# Patient Record
Sex: Male | Born: 1950 | Race: White | Hispanic: No | Marital: Married | Smoking: Former smoker
Health system: Southern US, Community
[De-identification: ages and names within clinical notes are randomized; demographics above are authoritative.]

## PROBLEM LIST (undated history)

## (undated) DIAGNOSIS — I10 Essential (primary) hypertension: Secondary | ICD-10-CM

## (undated) DIAGNOSIS — J189 Pneumonia, unspecified organism: Secondary | ICD-10-CM

## (undated) DIAGNOSIS — R569 Unspecified convulsions: Secondary | ICD-10-CM

## (undated) DIAGNOSIS — E785 Hyperlipidemia, unspecified: Secondary | ICD-10-CM

## (undated) DIAGNOSIS — K219 Gastro-esophageal reflux disease without esophagitis: Secondary | ICD-10-CM

## (undated) DIAGNOSIS — I639 Cerebral infarction, unspecified: Secondary | ICD-10-CM

## (undated) HISTORY — DX: Cerebral infarction, unspecified: I63.9

## (undated) HISTORY — DX: Pneumonia, unspecified organism: J18.9

## (undated) HISTORY — DX: Unspecified convulsions: R56.9

## (undated) HISTORY — PX: APPENDECTOMY: SHX54

## (undated) HISTORY — PX: UPPER GASTROINTESTINAL ENDOSCOPY: SHX188

## (undated) HISTORY — DX: Gastro-esophageal reflux disease without esophagitis: K21.9

## (undated) HISTORY — PX: CATARACT EXTRACTION, BILATERAL: SHX1313

## (undated) HISTORY — PX: EYE SURGERY: SHX253

---

## 2015-04-28 DIAGNOSIS — G40909 Epilepsy, unspecified, not intractable, without status epilepticus: Secondary | ICD-10-CM | POA: Insufficient documentation

## 2015-11-29 LAB — HM COLONOSCOPY

## 2016-10-29 DIAGNOSIS — R7303 Prediabetes: Secondary | ICD-10-CM | POA: Insufficient documentation

## 2017-07-10 ENCOUNTER — Encounter: Payer: Self-pay | Admitting: Neurology

## 2017-08-20 ENCOUNTER — Ambulatory Visit (INDEPENDENT_AMBULATORY_CARE_PROVIDER_SITE_OTHER): Payer: Medicare Other | Admitting: Neurology

## 2017-08-20 ENCOUNTER — Encounter: Payer: Self-pay | Admitting: Neurology

## 2017-08-20 VITALS — BP 144/66 | HR 58 | Ht 65.0 in | Wt 166.0 lb

## 2017-08-20 DIAGNOSIS — G40009 Localization-related (focal) (partial) idiopathic epilepsy and epileptic syndromes with seizures of localized onset, not intractable, without status epilepticus: Secondary | ICD-10-CM | POA: Diagnosis not present

## 2017-08-20 DIAGNOSIS — G250 Essential tremor: Secondary | ICD-10-CM | POA: Insufficient documentation

## 2017-08-20 MED ORDER — LEVETIRACETAM 1000 MG PO TABS: 1000.0000 mg | ORAL_TABLET | Freq: Two times a day (BID) | ORAL | 3 refills | Status: DC

## 2017-08-20 MED ORDER — PRIMIDONE 50 MG PO TABS: ORAL_TABLET | ORAL | 6 refills | Status: DC
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
## 2017-10-28 ENCOUNTER — Telehealth: Payer: Self-pay | Admitting: Neurology

## 2017-10-28 ENCOUNTER — Other Ambulatory Visit: Payer: Self-pay

## 2017-10-28 DIAGNOSIS — G250 Essential tremor: Secondary | ICD-10-CM

## 2017-10-28 MED ORDER — PRIMIDONE 50 MG PO TABS: ORAL_TABLET | ORAL | 3 refills | Status: DC

## 2017-10-28 NOTE — Telephone Encounter (Signed)
Rx - #480 (90 day supply) with 3 refills - sent to [facility].

## 2017-10-28 NOTE — Telephone Encounter (Signed)
Pt called and needs a prescription refill for primidone [facility]

## 2017-12-17 ENCOUNTER — Ambulatory Visit (INDEPENDENT_AMBULATORY_CARE_PROVIDER_SITE_OTHER): Payer: Medicare Other | Admitting: Neurology

## 2017-12-17 ENCOUNTER — Encounter: Payer: Self-pay | Admitting: Neurology

## 2017-12-17 VITALS — BP 142/64 | HR 62 | Ht 65.0 in | Wt 163.0 lb

## 2017-12-17 DIAGNOSIS — G40009 Localization-related (focal) (partial) idiopathic epilepsy and epileptic syndromes with seizures of localized onset, not intractable, without status epilepticus: Secondary | ICD-10-CM | POA: Diagnosis not present

## 2017-12-17 DIAGNOSIS — G250 Essential tremor: Secondary | ICD-10-CM | POA: Diagnosis not present

## 2017-12-17 MED ORDER — PRIMIDONE 50 MG PO TABS: ORAL_TABLET | ORAL | 3 refills | Status: DC
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
## 2018-04-29 ENCOUNTER — Ambulatory Visit (INDEPENDENT_AMBULATORY_CARE_PROVIDER_SITE_OTHER): Payer: Medicare Other | Admitting: Neurology

## 2018-04-29 ENCOUNTER — Encounter: Payer: Self-pay | Admitting: Neurology

## 2018-04-29 ENCOUNTER — Other Ambulatory Visit: Payer: Self-pay

## 2018-04-29 VITALS — BP 128/72 | HR 61 | Ht 65.0 in | Wt 163.0 lb

## 2018-04-29 DIAGNOSIS — G40009 Localization-related (focal) (partial) idiopathic epilepsy and epileptic syndromes with seizures of localized onset, not intractable, without status epilepticus: Secondary | ICD-10-CM

## 2018-04-29 DIAGNOSIS — G245 Blepharospasm: Secondary | ICD-10-CM

## 2018-04-29 DIAGNOSIS — G250 Essential tremor: Secondary | ICD-10-CM

## 2018-04-29 DIAGNOSIS — R413 Other amnesia: Secondary | ICD-10-CM | POA: Diagnosis not present

## 2018-04-29 MED ORDER — PRIMIDONE 50 MG PO TABS: ORAL_TABLET | ORAL | 3 refills | Status: DC

## 2018-04-29 MED ORDER — LEVETIRACETAM 1000 MG PO TABS: 1000.0000 mg | ORAL_TABLET | Freq: Two times a day (BID) | ORAL | 3 refills | Status: DC
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
## 2018-05-09 ENCOUNTER — Ambulatory Visit
Admission: RE | Admit: 2018-05-09 | Discharge: 2018-05-09 | Disposition: A | Discharge status: Home / Self Care | Payer: Medicare Other | Source: Ambulatory Visit | Attending: Neurology | Admitting: Neurology

## 2018-05-09 DIAGNOSIS — R413 Other amnesia: Secondary | ICD-10-CM

## 2018-05-09 DIAGNOSIS — G250 Essential tremor: Secondary | ICD-10-CM

## 2018-05-09 DIAGNOSIS — G40009 Localization-related (focal) (partial) idiopathic epilepsy and epileptic syndromes with seizures of localized onset, not intractable, without status epilepticus: Secondary | ICD-10-CM

## 2018-05-09 MED ORDER — GADOBENATE DIMEGLUMINE 529 MG/ML IV SOLN
15.0000 mL | Freq: Once | INTRAVENOUS | Status: AC | PRN
  Administered 2018-05-09: 15 mL via INTRAVENOUS

## 2018-05-19 ENCOUNTER — Telehealth: Payer: Self-pay

## 2018-05-19 NOTE — Telephone Encounter (Signed)
Spoke with pt's wife, Truddie HiddenLou, relaying message below.  She states that normal results don't really help as she just wants to know why pt is "acting stupid".  She states that "brain waves came back abnormal"  I let her know that EEG and MRI are different.  EEG shows brain wave activity whereas MRI shows structure of the brain.

## 2018-05-19 NOTE — Telephone Encounter (Signed)
-----   Message from Van ClinesKaren M Aquino, MD sent at 05/18/2018  4:26 PM EDT ----- Pls let him/wife know the MRI brain did not show any evidence of tumor, stroke, or bleed. Thanks

## 2018-10-19 DIAGNOSIS — L219 Seborrheic dermatitis, unspecified: Secondary | ICD-10-CM | POA: Insufficient documentation

## 2018-11-10 ENCOUNTER — Other Ambulatory Visit: Payer: Self-pay | Admitting: Neurology

## 2018-11-10 DIAGNOSIS — G40009 Localization-related (focal) (partial) idiopathic epilepsy and epileptic syndromes with seizures of localized onset, not intractable, without status epilepticus: Secondary | ICD-10-CM

## 2018-12-11 ENCOUNTER — Other Ambulatory Visit: Payer: Self-pay

## 2018-12-11 ENCOUNTER — Ambulatory Visit (INDEPENDENT_AMBULATORY_CARE_PROVIDER_SITE_OTHER): Payer: Medicare Other | Admitting: Neurology

## 2018-12-11 ENCOUNTER — Encounter: Payer: Self-pay | Admitting: Neurology

## 2018-12-11 VITALS — BP 116/64 | HR 63 | Ht 65.0 in | Wt 170.0 lb

## 2018-12-11 DIAGNOSIS — G40009 Localization-related (focal) (partial) idiopathic epilepsy and epileptic syndromes with seizures of localized onset, not intractable, without status epilepticus: Secondary | ICD-10-CM | POA: Diagnosis not present

## 2018-12-11 DIAGNOSIS — G250 Essential tremor: Secondary | ICD-10-CM | POA: Diagnosis not present

## 2018-12-11 DIAGNOSIS — R413 Other amnesia: Secondary | ICD-10-CM | POA: Diagnosis not present

## 2018-12-11 MED ORDER — PRIMIDONE 250 MG PO TABS: ORAL_TABLET | ORAL | 5 refills | Status: DC
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
## 2019-06-30 ENCOUNTER — Encounter (HOSPITAL_COMMUNITY): Payer: Self-pay | Admitting: *Deleted

## 2019-06-30 ENCOUNTER — Emergency Department (HOSPITAL_COMMUNITY): Payer: Medicare Other

## 2019-06-30 ENCOUNTER — Emergency Department (HOSPITAL_COMMUNITY)
Admission: EM | Admit: 2019-06-30 | Discharge: 2019-06-30 | Disposition: A | Discharge status: Home / Self Care | Payer: Medicare Other | Attending: Emergency Medicine | Admitting: Emergency Medicine

## 2019-06-30 ENCOUNTER — Other Ambulatory Visit: Payer: Self-pay

## 2019-06-30 DIAGNOSIS — Z87891 Personal history of nicotine dependence: Secondary | ICD-10-CM | POA: Insufficient documentation

## 2019-06-30 DIAGNOSIS — Z7982 Long term (current) use of aspirin: Secondary | ICD-10-CM | POA: Diagnosis not present

## 2019-06-30 DIAGNOSIS — Z79899 Other long term (current) drug therapy: Secondary | ICD-10-CM | POA: Diagnosis not present

## 2019-06-30 DIAGNOSIS — J449 Chronic obstructive pulmonary disease, unspecified: Secondary | ICD-10-CM | POA: Diagnosis not present

## 2019-06-30 DIAGNOSIS — I10 Essential (primary) hypertension: Secondary | ICD-10-CM | POA: Insufficient documentation

## 2019-06-30 DIAGNOSIS — R0602 Shortness of breath: Secondary | ICD-10-CM | POA: Insufficient documentation

## 2019-06-30 DIAGNOSIS — R062 Wheezing: Secondary | ICD-10-CM | POA: Diagnosis not present

## 2019-06-30 DIAGNOSIS — I700 Atherosclerosis of aorta: Secondary | ICD-10-CM | POA: Diagnosis not present

## 2019-06-30 DIAGNOSIS — R066 Hiccough: Secondary | ICD-10-CM | POA: Diagnosis not present

## 2019-06-30 DIAGNOSIS — J439 Emphysema, unspecified: Secondary | ICD-10-CM | POA: Insufficient documentation

## 2019-06-30 HISTORY — DX: Hyperlipidemia, unspecified: E78.5

## 2019-06-30 HISTORY — DX: Essential (primary) hypertension: I10

## 2019-06-30 LAB — CBC WITH DIFFERENTIAL/PLATELET
Abs Immature Granulocytes: 0.03 10*3/uL (ref 0.00–0.07)
Basophils Absolute: 0 10*3/uL (ref 0.0–0.1)
Basophils Relative: 1 %
Eosinophils Absolute: 0.1 10*3/uL (ref 0.0–0.5)
Eosinophils Relative: 2 %
HCT: 40 % (ref 39.0–52.0)
Hemoglobin: 13.3 g/dL (ref 13.0–17.0)
Immature Granulocytes: 1 %
Lymphocytes Relative: 26 %
Lymphs Abs: 1.7 10*3/uL (ref 0.7–4.0)
MCH: 30.6 pg (ref 26.0–34.0)
MCHC: 33.3 g/dL (ref 30.0–36.0)
MCV: 92 fL (ref 80.0–100.0)
Monocytes Absolute: 0.7 10*3/uL (ref 0.1–1.0)
Monocytes Relative: 11 %
Neutro Abs: 4 10*3/uL (ref 1.7–7.7)
Neutrophils Relative %: 59 %
Platelets: 229 10*3/uL (ref 150–400)
RBC: 4.35 MIL/uL (ref 4.22–5.81)
RDW: 13.1 % (ref 11.5–15.5)
WBC: 6.6 10*3/uL (ref 4.0–10.5)
nRBC: 0 % (ref 0.0–0.2)

## 2019-06-30 LAB — BASIC METABOLIC PANEL
Anion gap: 7 (ref 5–15)
BUN: 13 mg/dL (ref 8–23)
CO2: 26 mmol/L (ref 22–32)
Calcium: 8.8 mg/dL — ABNORMAL LOW (ref 8.9–10.3)
Chloride: 102 mmol/L (ref 98–111)
Creatinine, Ser: 0.93 mg/dL (ref 0.61–1.24)
GFR calc Af Amer: >60 mL/min (ref >60)
GFR calc non Af Amer: >60 mL/min (ref >60)
Glucose, Bld: 116 mg/dL — ABNORMAL HIGH (ref 70–99)
Potassium: 4.4 mmol/L (ref 3.5–5.1)
Sodium: 135 mmol/L (ref 135–145)

## 2019-06-30 MED ORDER — ALBUTEROL SULFATE HFA 108 (90 BASE) MCG/ACT IN AERS: 1.0000 | INHALATION_SPRAY | Freq: Four times a day (QID) | RESPIRATORY_TRACT | 2 refills | Status: DC | PRN

## 2019-06-30 MED ORDER — SUCRALFATE 1 G PO TABS: 1.0000 g | ORAL_TABLET | Freq: Three times a day (TID) | ORAL | 0 refills | Status: DC

## 2019-06-30 MED ORDER — IOHEXOL 300 MG/ML  SOLN
75.0000 mL | Freq: Once | INTRAMUSCULAR | Status: AC | PRN
  Administered 2019-06-30: 18:00:00 75 mL via INTRAVENOUS

## 2019-06-30 MED ORDER — ALBUTEROL SULFATE (2.5 MG/3ML) 0.083% IN NEBU: 5.0000 mg | INHALATION_SOLUTION | Freq: Once | RESPIRATORY_TRACT | Status: DC

## 2019-06-30 MED ORDER — SODIUM CHLORIDE (PF) 0.9 % IJ SOLN
INTRAMUSCULAR | Status: AC
  Filled 2019-06-30: qty 50

## 2019-06-30 NOTE — ED Provider Notes (Signed)
Care assumed from L. Cyndie MullMurphy PA-C.  Please see her full H&P.  In short,  Cole Bond is a 68 y.o. male presents for intractable hiccups and shortness of breath. This has been a problem x3 months. He has been evaluated by HI and ENT for this without symptom resolution. Today he felt short of breath three times while at work that resolved with putting his arms above his head. On ED arrival symptoms had resolved. Chest xray today shows COPD, this is new diagnosis. He is former smoker and quit x6 years ago. Labs today unremarkable. CT chest is pending. If negative will discharge home with albuterol inhaler.  Physical Exam  BP 132/66 (BP Location: Left Arm)   Pulse 62   Temp 98.7 F (37.1 C) (Oral)   Resp 18   Ht 5\' 5"  (1.651 m)   Wt 78.1 kg   SpO2 98%   BMI 28.65 kg/m   Physical Exam  PE: Constitutional: well-developed, well-nourished, no apparent distress HENT: normocephalic, atraumatic. no cervical adenopathy Cardiovascular: normal rate and rhythm, distal pulses intact Pulmonary/Chest: effort normal; breath sounds clear and equal bilaterally; no wheezes or rales Abdominal: soft and nontender Musculoskeletal: full ROM, no edema Neurological: alert with goal directed thinking Skin: warm and dry, no rash, no diaphoresis Psychiatric: normal mood and affect, normal behavior    ED Course/Procedures     Results for orders placed or performed during the hospital encounter of 06/30/19 (from the past 24 hour(s))  Basic metabolic panel     Status: Abnormal   Collection Time: 06/30/19  2:34 PM  Result Value Ref Range   Sodium 135 135 - 145 mmol/L   Potassium 4.4 3.5 - 5.1 mmol/L   Chloride 102 98 - 111 mmol/L   CO2 26 22 - 32 mmol/L   Glucose, Bld 116 (H) 70 - 99 mg/dL   BUN 13 8 - 23 mg/dL   Creatinine, Ser 1.610.93 0.61 - 1.24 mg/dL   Calcium 8.8 (L) 8.9 - 10.3 mg/dL   GFR calc non Af Amer >60 >60 mL/min   GFR calc Af Amer >60 >60 mL/min   Anion gap 7 5 - 15  CBC with  Differential     Status: None   Collection Time: 06/30/19  2:34 PM  Result Value Ref Range   WBC 6.6 4.0 - 10.5 K/uL   RBC 4.35 4.22 - 5.81 MIL/uL   Hemoglobin 13.3 13.0 - 17.0 g/dL   HCT 09.640.0 04.539.0 - 40.952.0 %   MCV 92.0 80.0 - 100.0 fL   MCH 30.6 26.0 - 34.0 pg   MCHC 33.3 30.0 - 36.0 g/dL   RDW 81.113.1 91.411.5 - 78.215.5 %   Platelets 229 150 - 400 K/uL   nRBC 0.0 0.0 - 0.2 %   Neutrophils Relative % 59 %   Neutro Abs 4.0 1.7 - 7.7 K/uL   Lymphocytes Relative 26 %   Lymphs Abs 1.7 0.7 - 4.0 K/uL   Monocytes Relative 11 %   Monocytes Absolute 0.7 0.1 - 1.0 K/uL   Eosinophils Relative 2 %   Eosinophils Absolute 0.1 0.0 - 0.5 K/uL   Basophils Relative 1 %   Basophils Absolute 0.0 0.0 - 0.1 K/uL   Immature Granulocytes 1 %   Abs Immature Granulocytes 0.03 0.00 - 0.07 K/uL   CHEST - 2 VIEW COMPARISON: None. FINDINGS: The heart size is normal. Lungs are hyperinflated. There is no edema or effusion. No focal airspace disease is present. The visualized soft tissues  and bony thorax are unremarkable. IMPRESSION: 1. Changes of COPD. 2. No acute cardiopulmonary disease. Electronically Signed By: Wynetta Fines.D.On: 06/30/2019 13:27  CT CHEST WITH CONTRAST  TECHNIQUE: Multidetector CT imaging of the chest was performed during intravenous contrast administration. CONTRAST: 7mL OMNIPAQUE IOHEXOL 300 MG/ML SOLN COMPARISON: Same day chest radiograph FINDINGS: Cardiovascular: Atherosclerotic calcification of the coronary arteries. Normal heart size. No pericardial effusion. Atherosclerotic plaque within the normal caliber aorta. Mediastinum/Nodes: No enlarged mediastinal or axillary lymph nodes. Thyroid gland and trachea demonstrate no significant findings. There is mild mural thickening of the esophagus. Lungs/Pleura: No consolidation, features of edema, pneumothorax, or effusion. Biapical pleuroparenchymal scarring is present. Background centrilobular and paraseptal emphysematous  changes within an apical predominance. Mild airways thickening. No suspicious pulmonary nodules or masses. Upper Abdomen: No acute abnormalities present in the visualized portions of the upper abdomen. Musculoskeletal: Multilevel degenerative changes are present in the imaged portions of the spine. No chest wall mass or suspicious bone lesions identified.  IMPRESSION: 1. No acute intrathoracic process. 2. Chronic bronchitic changes. 3. Mild mural thickening of the esophagus, suggestive of esophagitis. 4. Aortic Atherosclerosis (ICD10-I70.0) 5. Emphysema (ICD10-J43.9). Electronically Signed By: Lovena Le M.D. On: 06/30/2019 18:16    MDM   Patient is well-appearing, no acute distress.  Lungs are clear to auscultation all fields, he has SpO2 100% on room air. Labs are unremarkable. Chest xray shows COPD changes and CT chest shows esophagitis and emphysema, negative for acute intrathoracic processes. On reassessment pt continues to be well appearing and denies dyspnea. He ambulated around the room, no hypoxia noted. Will discharge home with albuterol inhaler and carafate for esophagitis. Pt has appointment already shceduled with his pcp for next week, recommend he keep that appointment. Pt agrees with plan and has no further questions. He is stable for discharge home.  This note was prepared using Dragon voice recognition software and may include unintentional dictation errors due to the inherent limitations of voice recognition software.       Flint Melter 06/30/19 1941    Isla Pence, MD 06/30/19 2158

## 2019-06-30 NOTE — ED Triage Notes (Signed)
Pt states he has had hiccups for 70 + days, noted episodes of SHOB with it at the beginning then went away, Today he had 3 episodes of SHOB for just a few minutes, when he raises his arms symptoms resolve.

## 2019-06-30 NOTE — Discharge Instructions (Addendum)
You have been seen today for hiccups and shortness of breath. Please read and follow all provided instructions. Return to the emergency room for worsening condition or new concerning symptoms.    Chest x-ray today shows COPD and the CT scan of your chest shows esophagitis.  1. Medications:  -Prescription has been sent to your pharmacy for Carafate.  This can help with esophagitis.  Please take as prescribed. -Prescription also sent for albuterol.  This is for shortness of breath, please take as needed. Continue usual home medications Take medications as prescribed. Please review all of the medicines and only take them if you do not have an allergy to them.   2. Treatment: rest, drink plenty of fluids  3. Follow Up: Please follow up with your primary doctor in 2-5 days for discussion of your diagnoses and further evaluation after today's visit  It is also a possibility that you have an allergic reaction to any of the medicines that you have been prescribed - Everybody reacts differently to medications and while MOST people have no trouble with most medicines, you may have a reaction such as nausea, vomiting, rash, swelling, shortness of breath. If this is the case, please stop taking the medicine immediately and contact your physician.  ?

## 2019-06-30 NOTE — ED Provider Notes (Signed)
[facility] Provider Note   CSN: [id number] Arrival date & time: 06/30/19  1226    History   Chief Complaint Chief Complaint  Patient presents with  . Shortness of Breath    HPI Cole Bond is a 68 y.o. male.     68 year old male presents with complaint of intractable hiccups and shortness of breath.  Patient states that he has had hiccups since May 2020, has been to his PCP as well as GI and ENT.  Patient has had an endoscopy as well as a CT of the abdomen pelvis which have not found a source for his hiccups.  Patient has tried multiple medications including Reglan, baclofen, Thorazine.  So these medications have provided temporary relief.  Patient was at work today when he began to feel short of breath, shortness of breath improved after raising his arms for a few seconds.  Reports 3 episodes like this today, states he has had similar episodes in the past but not recently and not as frequent.  Patient is a former smoker, states he is quit 6 to 7 years ago, no known history of asthma or chronic lung disease.  Denies fevers, chills, unintentional weight loss, headaches, changes with vision, speech, gait.  No other complaints or concerns.     Past Medical History:  Diagnosis Date  . Hyperlipemia   . Hypertension   . Seizures [facility](HCC)     Patient Active Problem List   Diagnosis Date Noted  . Localization-related idiopathic epilepsy and epileptic syndromes with seizures of localized onset, not intractable, without status epilepticus (HCC) 08/20/2017  . Essential tremor 08/20/2017    Past Surgical History:  Procedure Laterality Date  . APPENDECTOMY          Home Medications    Prior to Admission medications   Medication Sig Start Date End Date Taking? Authorizing Provider  aspirin (GOODSENSE ASPIRIN) 325 MG tablet Take 325 mg by mouth daily.    Yes [provider]  hydrochlorothiazide (MICROZIDE) 12.5 MG capsule Take 12.5  mg by mouth daily.  06/18/17  Yes [provider]  levETIRAcetam (KEPPRA) 1000 MG tablet TAKE 1 TABLET TWICE A DAY Patient taking differently: Take 1,000 mg by mouth 2 (two) times daily.  11/10/18  Yes Van ClinesAquino, Karen M, MD  lisinopril (PRINIVIL,ZESTRIL) 20 MG tablet Take 20 mg by mouth daily.  10/19/18  Yes [provider]  metoCLOPramide (REGLAN) 10 MG tablet Take 10 mg by mouth 4 (four) times daily. 06/25/19  Yes [provider]  omeprazole (PRILOSEC) 40 MG capsule Take 40 mg by mouth 2 (two) times daily. 05/21/19  Yes [provider]  pravastatin (PRAVACHOL) 40 MG tablet Take 40 mg by mouth at bedtime.  02/03/17  Yes [provider]  primidone (MYSOLINE) 250 MG tablet Take 1 tablet twice a day Patient taking differently: Take 250 mg by mouth 2 (two) times daily.  12/11/18  Yes Van ClinesAquino, Karen M, MD    Family History Family History  Problem Relation Age of Onset  . Breast cancer Mother   . Stroke Father     Social History Social History   Tobacco Use  . Smoking status: Former Games developermoker  . Smokeless tobacco: Never Used  Substance Use Topics  . Alcohol use: Yes    Alcohol/week: 1.0 standard drinks    Types: 1 Cans of beer per week  . Drug use: No     Allergies   Patient has no known allergies.  Review of Systems Review of Systems  Constitutional: Negative for appetite change, chills, fever and unexpected weight change.  HENT: Negative for ear pain.   Eyes: Negative for visual disturbance.  Respiratory: Positive for shortness of breath and wheezing. Negative for cough.   Cardiovascular: Negative for chest pain.  Gastrointestinal: Negative for nausea and vomiting.  Musculoskeletal: Negative for arthralgias and myalgias.  Skin: Negative for rash and wound.  Allergic/Immunologic: Negative for immunocompromised state.  Neurological: Negative for dizziness, speech difficulty and weakness.  Psychiatric/Behavioral: Negative for confusion.   All other systems reviewed and are negative.    Physical Exam Updated Vital Signs BP 132/66 (BP Location: Left Arm)   Pulse 62   Temp 98.7 F (37.1 C) (Oral)   Resp 18   Ht 5\' 5"  (1.651 m)   Wt 78.1 kg   SpO2 98%   BMI 28.65 kg/m   Physical Exam Vitals signs and nursing note reviewed.  Constitutional:      General: He is not in acute distress.    Appearance: He is well-developed. He is not diaphoretic.  HENT:     Head: Normocephalic and atraumatic.  Cardiovascular:     Rate and Rhythm: Normal rate and regular rhythm.     Heart sounds: Normal heart sounds.  Pulmonary:     Effort: Pulmonary effort is normal.     Breath sounds: Normal breath sounds. No decreased breath sounds or wheezing.     Comments: Constant hiccups throughout exam Musculoskeletal:     Right lower leg: No edema.     Left lower leg: No edema.  Skin:    General: Skin is warm and dry.  Neurological:     Mental Status: He is alert and oriented to person, place, and time.  Psychiatric:        Behavior: Behavior normal.      ED Treatments / Results  Labs (all labs ordered are listed, but only abnormal results are displayed) Labs Reviewed  BASIC METABOLIC PANEL - Abnormal; Notable for the following components:      Result Value   Glucose, Bld 116 (*)    Calcium 8.8 (*)    All other components within normal limits  CBC WITH DIFFERENTIAL/PLATELET    EKG EKG Interpretation  Date/Time:  Wednesday June 30 2019 12:45:07 EDT Ventricular Rate:  65 PR Interval:    QRS Duration: 93 QT Interval:  388 QTC Calculation: 404 R Axis:   63 Text Interpretation:  Sinus rhythm Non-specific ST-t changes No previous tracing Confirmed by Cathren LaineSteinl, Kevin ([id number]) on 06/30/2019 1:19:56 PM   Radiology Dg Chest 2 View  Result Date: 06/30/2019 CLINICAL DATA:  Shortness of breath.  Intermittent hiccups. EXAM: CHEST - 2 VIEW COMPARISON:  None. FINDINGS: The heart size is normal. Lungs are hyperinflated. There is no  edema or effusion. No focal airspace disease is present. The visualized soft tissues and bony thorax are unremarkable. IMPRESSION: 1. Changes of COPD. 2. No acute cardiopulmonary disease. Electronically Signed   By: Marin Robertshristopher  Mattern M.D.   On: 06/30/2019 13:27    Procedures Procedures (including critical care time)  Medications Ordered in ED Medications  albuterol (PROVENTIL) (2.5 MG/3ML) 0.083% nebulizer solution 5 mg (5 mg Nebulization Not Given 06/30/19 1501)     Initial Impression / Assessment and Plan / ED Course  I have reviewed the triage vital signs and the nursing notes.  Pertinent labs & imaging results that were available during my care of the patient were reviewed by me and  considered in my medical decision making (see chart for details).  Clinical Course as of Jun 29 1710  Wed Aug 05, 657  4313 68 year old male presents with complaint of hiccups since May 2020.  Patient seen by PCP, GI, ENT for same without source of hiccups found and without successful treatment.  Patient states at work today he began to feel short of breath, this resolved after a few seconds of raising his arms.  Patient states that he was wheezing with feeling short of breath.  Patient has felt short of breath off-and-on with his hiccups but never 3 episodes in 1 day which prompted him to come to the ER today.  Exam patient's lung sounds are clear, he is not short of breath at this time, he has constant hiccups throughout the exam. Patient has had an endoscopy as well as CT abdomen pelvis without source found for hiccups.  Chest x-ray today shows COPD, patient is a former smoker, quit smoking 6 years ago.  Gust chest x-ray results with patient, offered CT chest, patient is agreeable. Care signed out awaiting CT chest.    [LM]    Clinical Course User Index [LM] Tacy Learn, PA-C      Final Clinical Impressions(s) / ED Diagnoses   Final diagnoses:  None    ED Discharge Orders    None        Tacy Learn, PA-C 06/30/19 1711    Lajean Saver, MD 06/30/19 1723

## 2019-07-06 ENCOUNTER — Other Ambulatory Visit: Payer: Self-pay

## 2019-07-06 ENCOUNTER — Telehealth (INDEPENDENT_AMBULATORY_CARE_PROVIDER_SITE_OTHER): Payer: Medicare Other | Admitting: Neurology

## 2019-07-06 ENCOUNTER — Encounter: Payer: Self-pay | Admitting: Neurology

## 2019-07-06 VITALS — Ht 65.0 in | Wt 165.0 lb

## 2019-07-06 DIAGNOSIS — G250 Essential tremor: Secondary | ICD-10-CM

## 2019-07-06 DIAGNOSIS — R066 Hiccough: Secondary | ICD-10-CM | POA: Diagnosis not present

## 2019-07-06 DIAGNOSIS — G40009 Localization-related (focal) (partial) idiopathic epilepsy and epileptic syndromes with seizures of localized onset, not intractable, without status epilepticus: Secondary | ICD-10-CM

## 2019-07-06 MED ORDER — LEVETIRACETAM 1000 MG PO TABS: 1000.0000 mg | ORAL_TABLET | Freq: Two times a day (BID) | ORAL | 3 refills | Status: DC

## 2019-07-06 MED ORDER — PRIMIDONE 250 MG PO TABS: ORAL_TABLET | ORAL | 3 refills | Status: DC

## 2019-07-06 MED ORDER — CHLORPROMAZINE HCL 25 MG PO TABS: ORAL_TABLET | ORAL | 0 refills | Status: DC
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
## 2019-07-13 ENCOUNTER — Other Ambulatory Visit: Payer: Self-pay | Admitting: Neurology

## 2019-07-13 ENCOUNTER — Telehealth: Payer: Self-pay | Admitting: Neurology

## 2019-07-13 MED ORDER — CHLORPROMAZINE HCL 25 MG PO TABS: ORAL_TABLET | ORAL | 1 refills | Status: DC

## 2019-07-13 NOTE — Telephone Encounter (Signed)
Sorry about that, pls have him stop the metoclopramide and just take the chlorpromazine. Thanks!

## 2019-07-13 NOTE — Telephone Encounter (Signed)
Patient is calling in wanting to let you know everything is well. He is wanting to keep same prescription until MRI during last week of September. He is needing refill if he can stay on same medications- [facility] on [address] in [place]. Chlorpromazine medication. He took the last pill last night. Thanks!

## 2019-07-13 NOTE — Telephone Encounter (Signed)
What do you want him to do with the metoclopramide?

## 2019-07-13 NOTE — Telephone Encounter (Signed)
pls let him know I sent a prescription for another month. At this point if hiccups stopped, pls tell him to metoclopramide and just stay on the chlorpromazine. Thanks

## 2019-07-14 ENCOUNTER — Telehealth: Payer: Self-pay | Admitting: Neurology

## 2019-07-14 NOTE — Telephone Encounter (Signed)
Pt informed via mychart of Dr. Amparo Bristol recommendations

## 2019-07-14 NOTE — Telephone Encounter (Signed)
Spoke with wife, York Cerise. She state that pt had been without chlorpromazine for 2 days and the hiccups returned. She picked up the refill that Dr. Delice Lesch sent in yesterday today. They will see if this help. If it does not they will call back. Wife knows to have pt d/c Reglan.

## 2019-07-14 NOTE — Telephone Encounter (Signed)
Pt wants to speak to some one about what is going with him per the VM patient hiccups

## 2019-08-09 ENCOUNTER — Telehealth: Payer: Self-pay | Admitting: Neurology

## 2019-08-09 ENCOUNTER — Ambulatory Visit
Admission: RE | Admit: 2019-08-09 | Discharge: 2019-08-09 | Disposition: A | Discharge status: Home / Self Care | Payer: Medicare Other | Source: Ambulatory Visit | Attending: Neurology | Admitting: Neurology

## 2019-08-09 DIAGNOSIS — R066 Hiccough: Secondary | ICD-10-CM

## 2019-08-09 MED ORDER — GADOBUTROL 1 MMOL/ML IV SOLN
8.0000 mL | Freq: Once | INTRAVENOUS | Status: AC | PRN
  Administered 2019-08-09: 8 mL via INTRAVENOUS

## 2019-08-09 NOTE — Telephone Encounter (Signed)
-----   Message from Alda Berthold, DO sent at 08/09/2019 12:41 PM EDT ----- Please inform patient that his MRI brain is stable as compared to Jan 2019 - mild-age related changes, no new findings.

## 2019-08-09 NOTE — Telephone Encounter (Signed)
Left message informing of normal MRI. No changes since last MRI in 2019. Only mild age related changes. Instructed to call with any concerns.

## 2019-08-09 NOTE — Telephone Encounter (Signed)
Patient called and said he had an MRI today. He is requesting a call from a nurse as soon as possible because he still has the hiccups after fours month.   He further explained he stopped taking the chlorpromazine because it was making him sleepy. He'd like to know what to do next as he is frustrated.

## 2019-08-10 MED ORDER — HALOPERIDOL 1 MG PO TABS: ORAL_TABLET | ORAL | 3 refills | Status: DC

## 2019-08-10 NOTE — Telephone Encounter (Signed)
Spoke with pts wife. Is there any solution to the hiccups?  Wife has researched surgery and a pain injection that might would help.

## 2019-08-10 NOTE — Telephone Encounter (Signed)
I will have to see who does this type of surgery in [place], there are other medications which we can try, if he is agreeable, I have sent in a prescription for Haldol 1mg : Take 1 tab qhs x week, then increase to 2 tabs qhs. Main side effect is drowsiness.

## 2019-08-11 ENCOUNTER — Other Ambulatory Visit: Payer: Self-pay

## 2019-08-11 NOTE — Telephone Encounter (Signed)
Haldol sent to pharmacy. Wife ok to try. They would like for you to look into surgery or possible the injection to get rid of the hiccups. They are out very frustrated.

## 2019-08-18 ENCOUNTER — Other Ambulatory Visit: Payer: Self-pay

## 2019-08-18 ENCOUNTER — Ambulatory Visit (INDEPENDENT_AMBULATORY_CARE_PROVIDER_SITE_OTHER): Payer: Medicare Other | Admitting: Neurology

## 2019-08-18 ENCOUNTER — Encounter: Payer: Self-pay | Admitting: Neurology

## 2019-08-18 VITALS — BP 138/79 | HR 60 | Temp 98.4°F | Resp 12 | Ht 65.0 in | Wt 159.0 lb

## 2019-08-18 DIAGNOSIS — G40009 Localization-related (focal) (partial) idiopathic epilepsy and epileptic syndromes with seizures of localized onset, not intractable, without status epilepticus: Secondary | ICD-10-CM | POA: Diagnosis not present

## 2019-08-18 DIAGNOSIS — G250 Essential tremor: Secondary | ICD-10-CM | POA: Diagnosis not present

## 2019-08-18 DIAGNOSIS — R066 Hiccough: Secondary | ICD-10-CM | POA: Diagnosis not present

## 2019-08-18 MED ORDER — PRIMIDONE 250 MG PO TABS: ORAL_TABLET | ORAL | 3 refills | Status: DC

## 2019-08-18 MED ORDER — LEVETIRACETAM 1000 MG PO TABS: 1000.0000 mg | ORAL_TABLET | Freq: Two times a day (BID) | ORAL | 3 refills | Status: DC

## 2019-08-18 MED ORDER — HALOPERIDOL 1 MG PO TABS: ORAL_TABLET | ORAL | 2 refills | Status: DC
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
## 2019-08-24 ENCOUNTER — Ambulatory Visit: Payer: TRICARE For Life (TFL) | Admitting: Neurology

## 2019-08-26 ENCOUNTER — Telehealth: Payer: Self-pay | Admitting: Neurology

## 2019-08-26 NOTE — Telephone Encounter (Signed)
Patient called regarding his medication and he has been on it for 120 days. He said it is not working for him. He was taking 2 at night and now I in the AM and 1 in the PM. He would like to see what the next step is? Please Call. Thanks

## 2019-09-02 NOTE — Telephone Encounter (Signed)
Patient called and said he is still continuously having hiccups and the medication is not helping at all. He'd like another alternative prescribed to make it stop. Patient expressed frustration with his condition.  [place]

## 2019-09-02 NOTE — Telephone Encounter (Signed)
Dr. Delice Lesch, The pt was last seen in the office on 08/18/19. Does he need to try the medication longer or go ahead and try something else?

## 2019-09-03 NOTE — Telephone Encounter (Signed)
We had talked about at this point needing to just keep trying medication until we find something that helps. Ok to stop the Haldol. Pls start Depakote ER 250mg : take 1 tablet every night for 3 days, then increase to 2 tabs qhs. Call our office in 2 weeks, we will increase at that point. Also, let's do an MRI cervical spine with and without contrast, Dx: syrinx, intractable hiccups. Thanks

## 2019-09-06 ENCOUNTER — Other Ambulatory Visit: Payer: Self-pay

## 2019-09-06 DIAGNOSIS — G950 Syringomyelia and syringobulbia: Secondary | ICD-10-CM

## 2019-09-06 DIAGNOSIS — R066 Hiccough: Secondary | ICD-10-CM

## 2019-09-06 MED ORDER — DIVALPROEX SODIUM ER 250 MG PO TB24: ORAL_TABLET | ORAL | 3 refills | Status: DC

## 2019-09-06 NOTE — Telephone Encounter (Signed)
Left message for pt to return call.  Orders placed for MRI and Depakote as Dr. Delice Lesch prescribed.

## 2019-09-07 ENCOUNTER — Telehealth: Payer: Self-pay | Admitting: Neurology

## 2019-09-07 NOTE — Telephone Encounter (Signed)
Pt wife informed of Depakote and MRI being ordered. Instructions given. She knows to call in two weeks with an update.

## 2019-09-07 NOTE — Telephone Encounter (Signed)
Spouse called regarding [place] and a call they got from the office yesterday regarding medication. Please Call. Thanks

## 2019-09-18 ENCOUNTER — Other Ambulatory Visit: Payer: Self-pay

## 2019-09-18 ENCOUNTER — Ambulatory Visit
Admission: RE | Admit: 2019-09-18 | Discharge: 2019-09-18 | Disposition: A | Discharge status: Home / Self Care | Payer: Medicare Other | Source: Ambulatory Visit | Attending: Neurology | Admitting: Neurology

## 2019-09-18 DIAGNOSIS — R066 Hiccough: Secondary | ICD-10-CM

## 2019-09-18 DIAGNOSIS — G950 Syringomyelia and syringobulbia: Secondary | ICD-10-CM

## 2019-09-18 MED ORDER — GADOBENATE DIMEGLUMINE 529 MG/ML IV SOLN
15.0000 mL | Freq: Once | INTRAVENOUS | Status: AC | PRN
  Administered 2019-09-18: 15 mL via INTRAVENOUS

## 2019-09-24 ENCOUNTER — Telehealth: Payer: Self-pay | Admitting: Neurology

## 2019-09-24 NOTE — Telephone Encounter (Signed)
Left message with the after hour service on 09-24-19 @ 12:42 pm   Caller states his new medication is not working still has the hiccups and wants to let his Dr know please call

## 2019-09-25 ENCOUNTER — Other Ambulatory Visit: Payer: TRICARE For Life (TFL)

## 2019-09-27 MED ORDER — DIVALPROEX SODIUM ER 250 MG PO TB24: ORAL_TABLET | ORAL | 3 refills | Status: DC

## 2019-09-27 NOTE — Telephone Encounter (Signed)
Called spoke with spouse York Cerise she was informed of provider response they will do the increase dose. She will contact PCP to request referral for second opinion at [facility].   Pt will need NEW RX for increase dose sent to [facility] sent  Comment  08/20/17 DPR SIGNED FOR ALL [facility]. OK TO DISCLOSE PHI TO LOU Wierzbicki ([place]) [phone]. OK TO LEAVE A DETAILED MESSAGE ON HOME ANSWERING MACHINE. [facility]

## 2019-09-27 NOTE — Telephone Encounter (Signed)
If no side effects from the Depakote 250mg  2 tabs qhs, pls increase to 2 tabs BID. We had discussed that at this point it is really trying different medications to see what can help. Consider second opinion at [facility] or [facility]

## 2019-11-17 IMAGING — MR MR CERVICAL SPINE WO/W CM
6 of 8 series · 33 of 48 positions shown · IV contrast (multihance)
Comparison: Cranial MRI 08/09/2019 and 05/09/2018.

CLINICAL DATA: Intractable hiccups with bilateral hand tremors for
4 months. No acute injury or prior relevant surgery. Cervical syrinx
on cranial MRI.

Creatinine was obtained on site at [HOSPITAL] at [HOSPITAL].
Results: Creatinine 0.9 mg/dL.
EXAM:
MRI CERVICAL SPINE WITHOUT AND WITH CONTRAST
TECHNIQUE: Multiplanar and multiecho pulse sequences of the cervical spine, to
include the craniocervical junction and cervicothoracic junction,
were obtained without and with intravenous contrast.
CONTRAST:  15mL MULTIHANCE GADOBENATE DIMEGLUMINE 529 MG/ML IV SOLN

[Series 2: T1 · sagittal · 3.0mm · 0.82mm/px · 5 of 17 slices shown (1 of 2)]
[im 1/17]
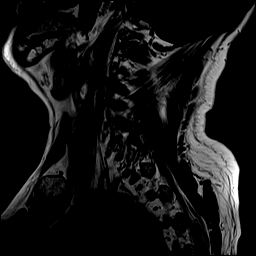
[im 5/17]
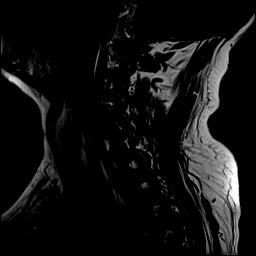
[im 9/17]
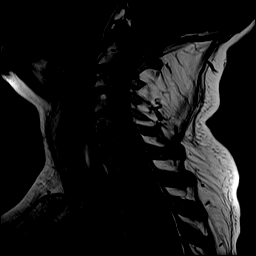
[im 13/17]
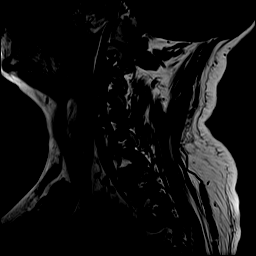
[im 17/17]
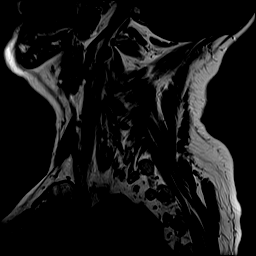

[Series 3: STIR · sagittal · 3.0mm · 0.82mm/px · 4 of 17 slices shown]
[im 1/17]
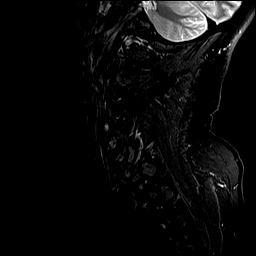
[im 5/17]
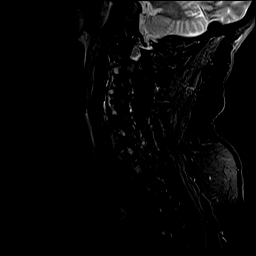
[im 9/17]
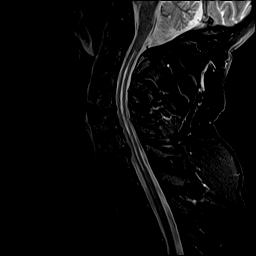
[im 13/17]
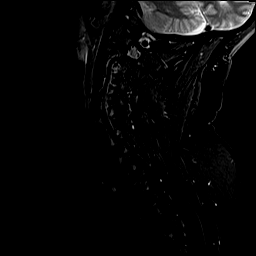

[Series 4: T2 · axial · 3.0mm · 0.70mm/px · z∈[-88,+1]mm · 7 of 27 slices shown (1 of 2)]
[im 1/27]
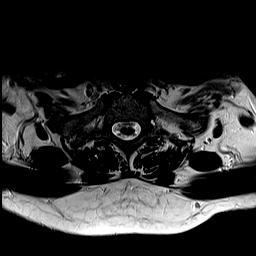
[im 5/27]
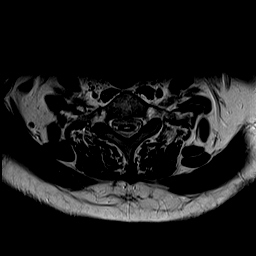
[im 9/27]
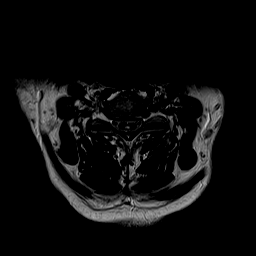
[im 14/27]
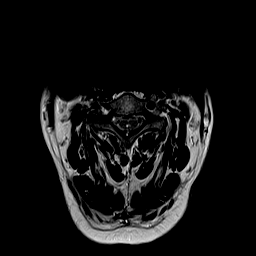
[im 18/27]
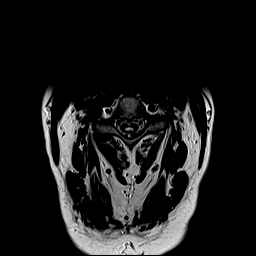
[im 22/27]
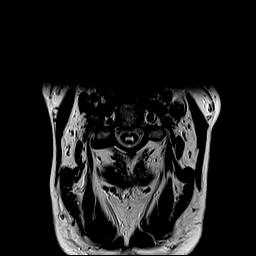
[im 27/27]
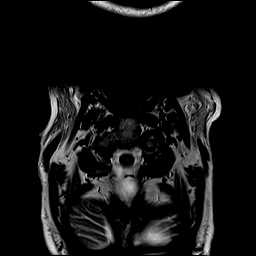

[Series 6: T1 · axial · 3.0mm · 0.35mm/px · z∈[-88,+1]mm · 7 of 27 slices shown (2 of 2)]
[im 1/27]
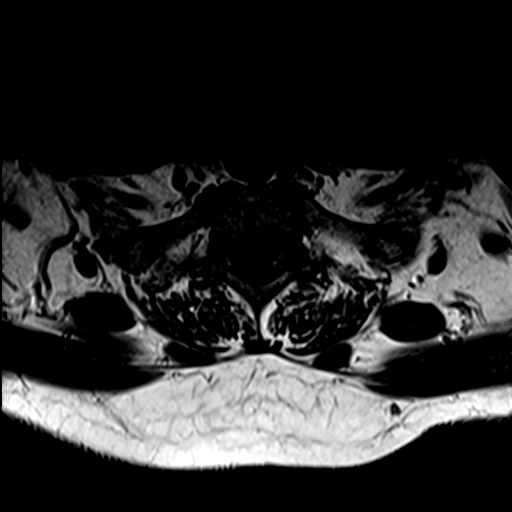
[im 5/27]
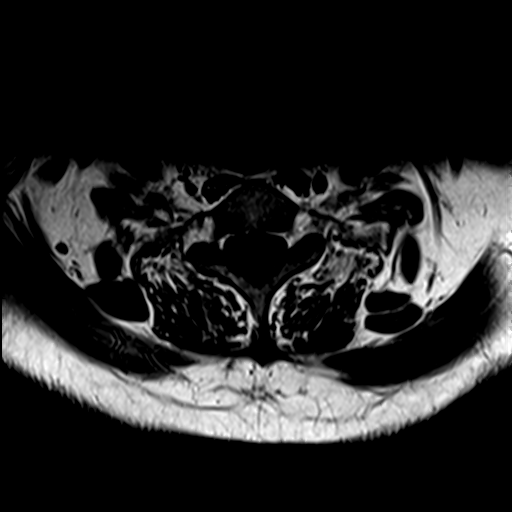
[im 9/27]
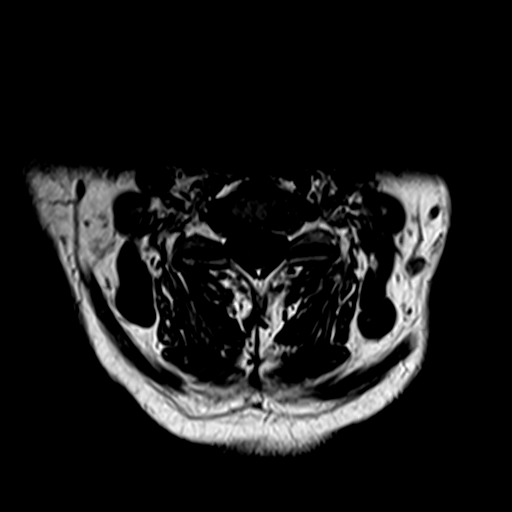
[im 14/27]
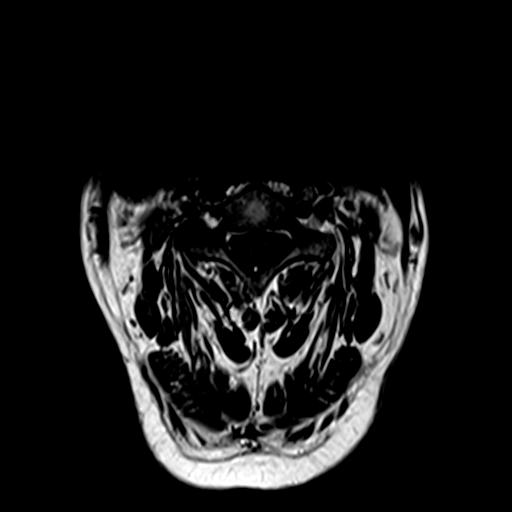
[im 18/27]
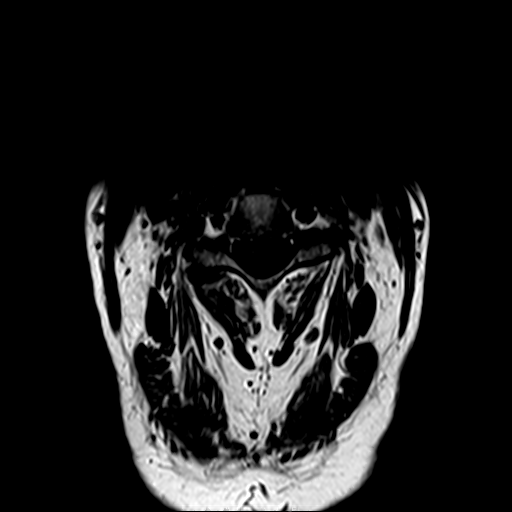
[im 22/27]
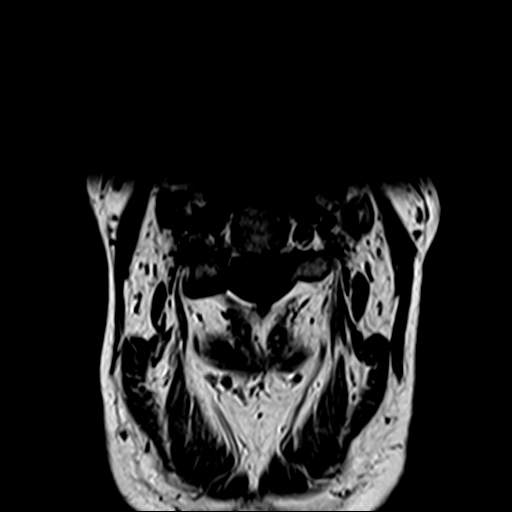
[im 27/27]
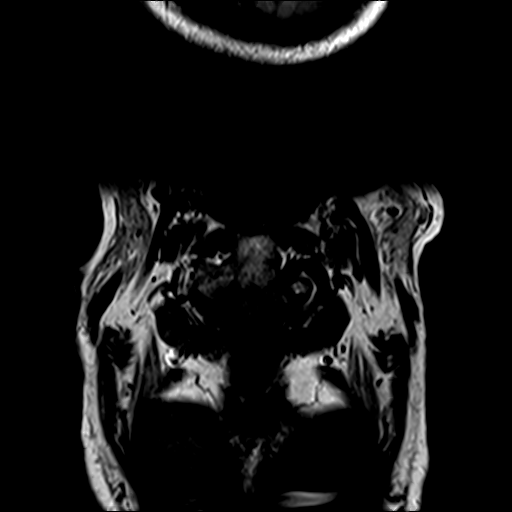

[Series 7: T2 · sagittal · 3.0mm · 0.41mm/px · 5 of 17 slices shown (2 of 2)]
[im 1/17]
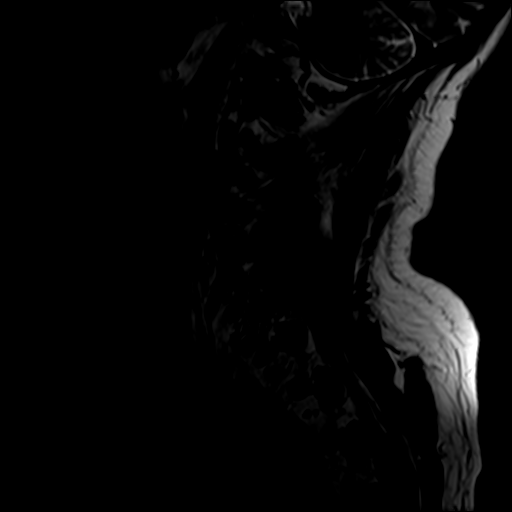
[im 5/17]
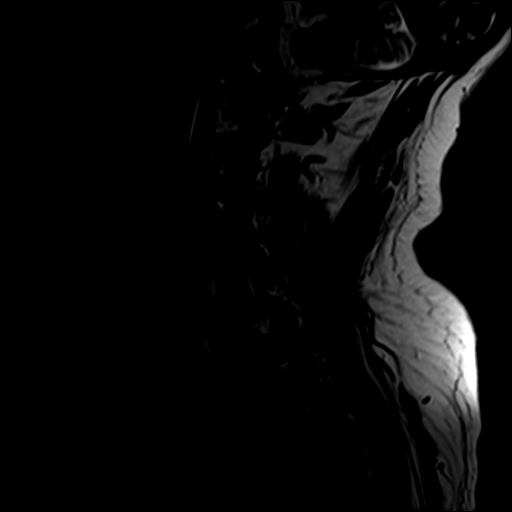
[im 9/17]
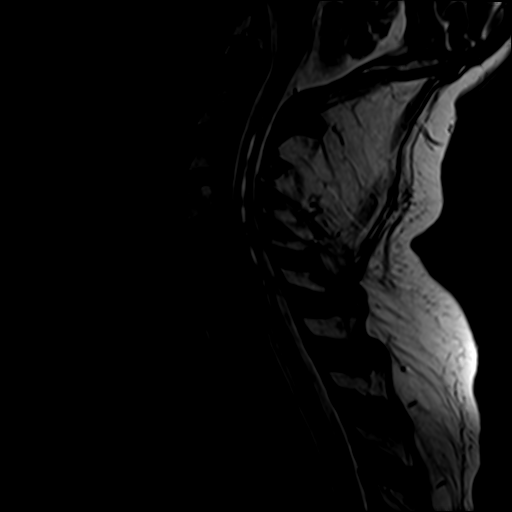
[im 13/17]
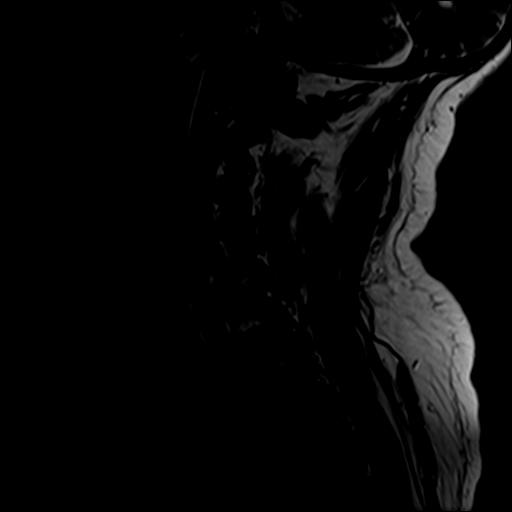
[im 17/17]
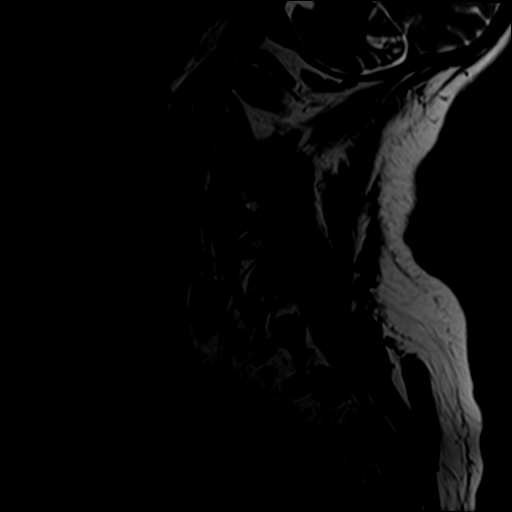

[Series 8: T1 fat-sat post-contrast · sagittal · 3.0mm · 0.82mm/px · 5 of 17 slices shown]
[im 1/17]
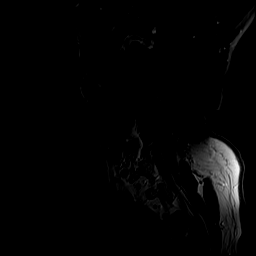
[im 5/17]
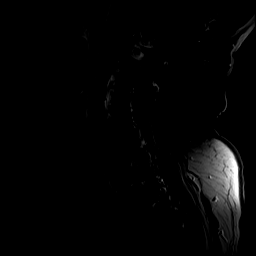
[im 9/17]
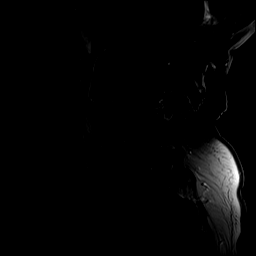
[im 13/17]
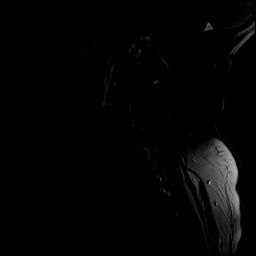
[im 17/17]
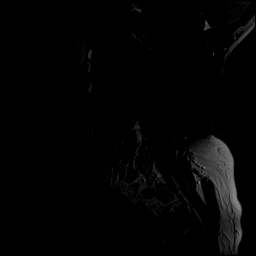

[33 of 48 positions shown; findings below may reference images not displayed]

FINDINGS: Alignment: Normal.

Vertebrae: No acute or suspicious osseous findings. There is no
abnormal osseous enhancement.

Cord: Cervical cord syrinx extends from the mid C2 level to the
C7-T1 disc space. The syrinx measures up to 3 x 6 mm transverse on
image [DATE]. There is no abnormal intradural enhancement. The
cervical cord is otherwise normal in signal and caliber.

Posterior Fossa, vertebral arteries, paraspinal tissues: There are
mildly low lying cervical tonsils without Chiari malformation. No
paraspinal abnormalities are identified. Bilateral vertebral artery
flow voids.

Disc levels:

C2-3: Mild right-sided facet hypertrophy. No spinal stenosis or
nerve root encroachment.

C3-4: Mild facet hypertrophy and posterior osteophyte formation
asymmetric to the right. No spinal stenosis or nerve root
encroachment.

C4-5: Tiny central disc protrusion, mild uncinate spurring and mild
asymmetric facet hypertrophy on the left. No significant spinal
stenosis or nerve root encroachment.

C5-6: Mild spondylosis with posterior osteophytes covering diffusely
bulging disc material. The CSF surrounding the cord is partially
effaced with narrowing of the AP diameter of the canal to 9 mm. Mild
right-greater-than-left foraminal narrowing.

C6-7: There is spondylosis with posterior osteophytes covering
diffusely bulging disc material. The CSF surrounding the cord is
partially effaced with narrowing of the AP diameter of the canal to
10 mm. There is no cord deformity. Mild right and moderate left
foraminal narrowing.

C7-T1: No significant findings.
IMPRESSION: 1. Cervical cord syrinx extending from the mid C2 level to the C7-T1
disc space. No abnormal intradural enhancement.
2. Spondylosis at C5-6 and C6-7 contributes to mild spinal stenosis
and foraminal narrowing, greatest on the left at C6-7.
3. No high-grade spinal stenosis or cord deformity.

## 2019-11-23 ENCOUNTER — Telehealth (INDEPENDENT_AMBULATORY_CARE_PROVIDER_SITE_OTHER): Payer: Medicare Other | Admitting: Neurology

## 2019-11-23 ENCOUNTER — Other Ambulatory Visit: Payer: Self-pay

## 2019-11-23 ENCOUNTER — Encounter: Payer: Self-pay | Admitting: Neurology

## 2019-11-23 VITALS — Ht 65.0 in | Wt 156.0 lb

## 2019-11-23 DIAGNOSIS — G950 Syringomyelia and syringobulbia: Secondary | ICD-10-CM

## 2019-11-23 DIAGNOSIS — G40009 Localization-related (focal) (partial) idiopathic epilepsy and epileptic syndromes with seizures of localized onset, not intractable, without status epilepticus: Secondary | ICD-10-CM

## 2019-11-23 DIAGNOSIS — G250 Essential tremor: Secondary | ICD-10-CM

## 2019-11-23 DIAGNOSIS — R066 Hiccough: Secondary | ICD-10-CM | POA: Diagnosis not present
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
## 2019-11-29 ENCOUNTER — Other Ambulatory Visit: Payer: Self-pay

## 2019-11-29 DIAGNOSIS — G950 Syringomyelia and syringobulbia: Secondary | ICD-10-CM

## 2019-12-01 ENCOUNTER — Telehealth: Payer: Self-pay | Admitting: Neurology

## 2019-12-01 NOTE — Telephone Encounter (Signed)
Wife aware of MRI being scheduled for tomorrow. She said she knew about it and just wanted to make sure he needed in prior to [facility] appt.

## 2019-12-01 NOTE — Telephone Encounter (Signed)
Patient's wife called wanting to know if her husband should have his MRI prior to going to [facility]?

## 2019-12-02 ENCOUNTER — Ambulatory Visit
Admission: RE | Admit: 2019-12-02 | Discharge: 2019-12-02 | Disposition: A | Discharge status: Home / Self Care | Payer: Medicare Other | Source: Ambulatory Visit | Attending: Neurology | Admitting: Neurology

## 2019-12-02 DIAGNOSIS — G950 Syringomyelia and syringobulbia: Secondary | ICD-10-CM

## 2019-12-02 MED ORDER — GADOBENATE DIMEGLUMINE 529 MG/ML IV SOLN
15.0000 mL | Freq: Once | INTRAVENOUS | Status: AC | PRN
  Administered 2019-12-02: 15 mL via INTRAVENOUS

## 2019-12-03 ENCOUNTER — Telehealth: Payer: Self-pay

## 2019-12-03 NOTE — Telephone Encounter (Signed)
-----   Message from Van Clines, MD sent at 12/03/2019  8:40 AM EST ----- Pls let him know the lower spinal cord is normal. Pls wish him well with his upcoming [facility] appointment. They should have our records available to them on MyChart. Thanks

## 2019-12-03 NOTE — Telephone Encounter (Signed)
Pt is at work message left for him to call back to go over MRI results,

## 2019-12-03 NOTE — Telephone Encounter (Signed)
Pt's wife informed of results. No concerns at this time.

## 2019-12-08 ENCOUNTER — Telehealth: Payer: Self-pay | Admitting: Neurology

## 2019-12-08 NOTE — Telephone Encounter (Signed)
Dr. Aida Raider left msg with after hours wanting to speak with Dr. Karel Jarvis on patient and give her some notes on him. Phone number is [phone]. Thanks!

## 2019-12-09 NOTE — Telephone Encounter (Signed)
Pt's wife informed and understood. He will start 12/10/19 taking 250mg  qhs.  Wife knows to call in 2 weeks with an update. Follow up with is scheduled at the end of June and pt also has a follow up with [facility] June 1st per wife.

## 2019-12-09 NOTE — Telephone Encounter (Signed)
[facility] note reviewed. Recommended to try reducing Primidone to see if this is contributing to hiccups. Pls call patient to let him know to reduce the Primidone 250mg  to 1 tab qhs (he is on BID currently). This was prescribed for his tremor, not sure it was helping for tremor anyway, so let's reduce dose and see if this helps with the hiccups as recommended by [facility]. Thanks

## 2019-12-09 NOTE — Telephone Encounter (Signed)
Called back number listed, Dr. Judeth Cornfield paged at 9:15AM, awaiting callback

## 2019-12-09 NOTE — Telephone Encounter (Signed)
Notes requested from [facility] today for Dr. Karel Jarvis to review. She may also be able to review them in care everywhere.

## 2019-12-24 ENCOUNTER — Telehealth: Payer: Self-pay | Admitting: Neurology

## 2019-12-24 NOTE — Telephone Encounter (Signed)
The following message was left with [employer] on 12/24/19 at 12:00 PM.  Caller states Dr. Karel Jarvis changed his medication for his chronic hiccups. She wanted him to call and let her know how it was working. He has seen no improvement on his medication.   Caller states he is at work. He provided the phone number for his wife who will get any information he needs to know.

## 2019-12-27 NOTE — Telephone Encounter (Signed)
Spoke to pt he stated that his tremors have gotten a little worse with the reduce in the primidone but he wants to try stopping the primidone to see if it helps stop the hiccups,

## 2019-12-27 NOTE — Telephone Encounter (Signed)
Ok, hopefully it helps, thanks

## 2019-12-27 NOTE — Telephone Encounter (Signed)
If he has not noticed any worsening of the tremor when we reduced the primidone, then we can just stop it and see if getting off medication improves the hiccups. Thanks

## 2020-06-21 ENCOUNTER — Ambulatory Visit (INDEPENDENT_AMBULATORY_CARE_PROVIDER_SITE_OTHER): Payer: Medicare Other | Admitting: Neurology

## 2020-06-21 ENCOUNTER — Other Ambulatory Visit: Payer: Self-pay

## 2020-06-21 ENCOUNTER — Encounter: Payer: Self-pay | Admitting: Neurology

## 2020-06-21 VITALS — BP 135/74 | HR 68 | Ht 65.0 in | Wt 144.6 lb

## 2020-06-21 DIAGNOSIS — G250 Essential tremor: Secondary | ICD-10-CM

## 2020-06-21 DIAGNOSIS — R066 Hiccough: Secondary | ICD-10-CM | POA: Diagnosis not present

## 2020-06-21 DIAGNOSIS — G40009 Localization-related (focal) (partial) idiopathic epilepsy and epileptic syndromes with seizures of localized onset, not intractable, without status epilepticus: Secondary | ICD-10-CM | POA: Diagnosis not present

## 2020-06-21 MED ORDER — LEVETIRACETAM 1000 MG PO TABS: 1000.0000 mg | ORAL_TABLET | Freq: Two times a day (BID) | ORAL | 3 refills | Status: DC

## 2020-06-21 MED ORDER — CLONAZEPAM 0.5 MG PO TABS: 0.5000 mg | ORAL_TABLET | Freq: Two times a day (BID) | ORAL | 4 refills | Status: DC
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
## 2020-08-07 ENCOUNTER — Other Ambulatory Visit: Payer: Self-pay | Admitting: Neurology

## 2020-08-07 DIAGNOSIS — G40009 Localization-related (focal) (partial) idiopathic epilepsy and epileptic syndromes with seizures of localized onset, not intractable, without status epilepticus: Secondary | ICD-10-CM

## 2021-01-24 ENCOUNTER — Encounter: Payer: Self-pay | Admitting: Neurology

## 2021-01-24 ENCOUNTER — Other Ambulatory Visit: Payer: Self-pay

## 2021-01-24 ENCOUNTER — Ambulatory Visit (INDEPENDENT_AMBULATORY_CARE_PROVIDER_SITE_OTHER): Payer: Medicare Other | Admitting: Neurology

## 2021-01-24 VITALS — BP 141/64 | HR 62 | Ht 65.0 in | Wt 150.0 lb

## 2021-01-24 DIAGNOSIS — R066 Hiccough: Secondary | ICD-10-CM | POA: Diagnosis not present

## 2021-01-24 DIAGNOSIS — G40009 Localization-related (focal) (partial) idiopathic epilepsy and epileptic syndromes with seizures of localized onset, not intractable, without status epilepticus: Secondary | ICD-10-CM

## 2021-01-24 DIAGNOSIS — G250 Essential tremor: Secondary | ICD-10-CM

## 2021-01-24 MED ORDER — LEVETIRACETAM 1000 MG PO TABS: 1000.0000 mg | ORAL_TABLET | Freq: Two times a day (BID) | ORAL | 3 refills | Status: DC

## 2021-01-24 MED ORDER — TOPIRAMATE 25 MG PO TABS: 25.0000 mg | ORAL_TABLET | Freq: Two times a day (BID) | ORAL | 6 refills | Status: DC
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
## 2021-02-05 ENCOUNTER — Other Ambulatory Visit: Payer: Self-pay | Admitting: Neurology

## 2021-02-05 DIAGNOSIS — G40009 Localization-related (focal) (partial) idiopathic epilepsy and epileptic syndromes with seizures of localized onset, not intractable, without status epilepticus: Secondary | ICD-10-CM

## 2021-02-27 ENCOUNTER — Telehealth: Payer: Self-pay | Admitting: Neurology

## 2021-02-27 ENCOUNTER — Other Ambulatory Visit: Payer: Self-pay

## 2021-02-27 DIAGNOSIS — G40009 Localization-related (focal) (partial) idiopathic epilepsy and epileptic syndromes with seizures of localized onset, not intractable, without status epilepticus: Secondary | ICD-10-CM

## 2021-02-27 MED ORDER — LEVETIRACETAM 1000 MG PO TABS: 1000.0000 mg | ORAL_TABLET | Freq: Two times a day (BID) | ORAL | 2 refills | Status: DC

## 2021-02-27 NOTE — Telephone Encounter (Signed)
[facility]

## 2021-02-27 NOTE — Telephone Encounter (Signed)
[facility]  He would like to 90 day supply. They do not want it sent to [facility]

## 2021-05-29 DIAGNOSIS — H2510 Age-related nuclear cataract, unspecified eye: Secondary | ICD-10-CM | POA: Insufficient documentation

## 2021-07-03 DIAGNOSIS — E611 Iron deficiency: Secondary | ICD-10-CM | POA: Insufficient documentation

## 2021-10-04 ENCOUNTER — Ambulatory Visit: Payer: Medicare Other | Admitting: Neurology

## 2021-12-31 ENCOUNTER — Other Ambulatory Visit: Payer: Self-pay | Admitting: Neurology

## 2021-12-31 DIAGNOSIS — G40009 Localization-related (focal) (partial) idiopathic epilepsy and epileptic syndromes with seizures of localized onset, not intractable, without status epilepticus: Secondary | ICD-10-CM

## 2022-01-11 ENCOUNTER — Ambulatory Visit: Payer: Medicare Other | Admitting: Family Medicine

## 2022-01-17 ENCOUNTER — Ambulatory Visit (INDEPENDENT_AMBULATORY_CARE_PROVIDER_SITE_OTHER): Payer: Medicare Other | Admitting: Family Medicine

## 2022-01-17 ENCOUNTER — Encounter: Payer: Self-pay | Admitting: Family Medicine

## 2022-01-17 VITALS — BP 140/61 | HR 64 | Resp 20 | Ht 65.0 in | Wt 145.8 lb

## 2022-01-17 DIAGNOSIS — Z7689 Persons encountering health services in other specified circumstances: Secondary | ICD-10-CM

## 2022-01-17 DIAGNOSIS — R066 Hiccough: Secondary | ICD-10-CM | POA: Diagnosis not present

## 2022-01-17 MED ORDER — GABAPENTIN 100 MG PO CAPS: 100.0000 mg | ORAL_CAPSULE | Freq: Three times a day (TID) | ORAL | 0 refills | Status: DC

## 2022-01-17 NOTE — Progress Notes (Signed)
______________________________________________________________________  HPI Cole Bond is a 71 y.o. male presenting to [facility] today to establish care. He is retired Hotel manager.   Patient Care Team: Clayborne Dana, NP as PCP - General (Family Medicine) Van Clines, MD as Consulting Physician (Neurology)  Health Maintenance  Topic Date Due   Hepatitis C Screening: USPSTF Recommendation to screen - Ages 63-79 yo.  Never done   Flu Shot  02/22/2022*   Zoster (Shingles) Vaccine (1 of 2) 04/16/2022*   Pneumonia Vaccine (2 - PPSV23 if available, else PCV20) 01/17/2023*   Tetanus Vaccine  01/17/2023*   Colon Cancer Screening  11/28/2025   HPV Vaccine  Aged Out   COVID-19 Vaccine  Discontinued  *Topic was postponed. The date shown is not the original due date.     Concerns today: HTN - well controlled at home, no symptoms, regular diet, very active (works in a warehouse) HLD - no symptoms, no side effects  Seizures - started around age 89, none since starting Keppra, sees neurologist every 6 months or so   Hiccups - daily for 3 years, stops for a few hours at a time; has tried 10+ meds and multiple home remedies with no improvement, best he ever got was 2 weeks max of no hiccups, but didn't last. He has heard about gabapentin being used for hiccups and would like to discuss trying. I personally reviewed UpToDate and gabapentin is listed as an option to try. He has tried all other recommended options without success.    Patient Active Problem List   Diagnosis Date Noted   Localization-related idiopathic epilepsy and epileptic syndromes with seizures of localized onset, not intractable, without status epilepticus (HCC) 08/20/2017   Essential tremor 08/20/2017    PHQ9 Today: Depression screen PHQ 2/9 01/17/2022  Decreased Interest 0  Down, Depressed, Hopeless 0  PHQ - 2 Score 0   GAD7 Today: No flowsheet data  found. ______________________________________________________________________ PMH Past Medical History:  Diagnosis Date   Hyperlipemia    Hypertension    Seizures (HCC)     ROS All review of systems negative except what is listed in the HPI  PHYSICAL EXAM Physical Exam Vitals reviewed.  Constitutional:      Appearance: Normal appearance.  Cardiovascular:     Rate and Rhythm: Normal rate and regular rhythm.  Pulmonary:     Effort: Pulmonary effort is normal.     Breath sounds: Normal breath sounds.  Skin:    General: Skin is warm and dry.     Capillary Refill: Capillary refill takes less than 2 seconds.  Neurological:     General: No focal deficit present.     Mental Status: He is alert and oriented to person, place, and time. Mental status is at baseline.  Psychiatric:        Mood and Affect: Mood normal.        Behavior: Behavior normal.        Thought Content: Thought content normal.        Judgment: Judgment normal.   ______________________________________________________________________ ASSESSMENT AND PLAN  1. Encounter to establish care Education provided today during visit and on AVS for patient to review at home.  Diet and Exercise recommendations provided.  Current diagnoses and recommendations discussed. HM recommendations reviewed with recommendations.    2. Chronic hiccups Per UpToDate, gabapentin can be trialed for chronic hiccups. Discussed medication and safety with patient. He would like to try. If no improvement, recommend  he see if his neurologist has any other suggestions.  - gabapentin (NEURONTIN) 100 MG capsule; Take 1 capsule (100 mg total) by mouth 3 (three) times daily.  Dispense: 90 capsule; Refill: 0     Outpatient Encounter Medications as of 01/17/2022  Medication Sig   aspirin 325 MG tablet Take 325 mg by mouth daily.    atorvastatin (LIPITOR) 20 MG tablet Take 20 mg by mouth daily.   Ferrous Sulfate (IRON PO) Take 45 mg by mouth  daily.   gabapentin (NEURONTIN) 100 MG capsule Take 1 capsule (100 mg total) by mouth 3 (three) times daily.   hydrochlorothiazide (MICROZIDE) 12.5 MG capsule Take 12.5 mg by mouth daily.    levETIRAcetam (KEPPRA) 1000 MG tablet TAKE 1 TABLET TWICE A DAY   lisinopril (PRINIVIL,ZESTRIL) 20 MG tablet Take 20 mg by mouth daily.    [DISCONTINUED] topiramate (TOPAMAX) 25 MG tablet Take 1 tablet (25 mg total) by mouth 2 (two) times daily.   No facility-administered encounter medications on file as of 01/17/2022.    Return in about 3 months (around 04/16/2022) for routine f/u with fasting labs .   I spent 30 minutes dedicated to the care of this patient on the date of this encounter to include pre-visit chart review of prior notes and results, face-to-face time with the patient, and post-visit ordering of testing as indicated.     Lollie Marrow Reola Calkins, DNP, FNP-C

## 2022-01-17 NOTE — Patient Instructions (Addendum)
Thank you for choosing [facility] at [facility] for your Primary Care needs. I am excited for the opportunity to partner with you to meet your health care goals. It was a pleasure meeting you today!  Recommendations from today's visit: Gabapentin trial for hiccups - start gradually, can get up to 3 times per day, if not making any difference after a 1 month trial, we will stop the medication.   Information on diet, exercise, and health maintenance recommendations are listed below. This is information to help you be sure you are on track for optimal health and monitoring.   Please look over this and let us know if you have any questions or if you have completed any of the health maintenance outside of [facility] so that we can be sure your records are up to date.  ___________________________________________________________  MyChart:  For all urgent or time sensitive needs we ask that you please call the office to avoid delays. Our number is [phone]. MyChart is not constantly monitored and due to the large volume of messages a day, replies may take up to 72 business hours.  MyChart Policy: MyChart allows for you to see your visit notes, after visit summary, provider recommendations, lab and tests results, make an appointment, request refills, and contact your provider or the office for non-urgent questions or concerns. Providers are seeing patients during normal business hours and do not have built in time to review MyChart messages.  We ask that you allow a minimum of 3 business days for responses to [employer]. For this reason, please do not send urgent requests through MyChart. Please call the office at [phone]. New and ongoing conditions may require a visit. We have virtual and in-person visits available for your convenience.  Complex MyChart concerns may require a visit. Your provider may request you schedule a virtual or in-person visit to ensure we are  providing the best care possible. MyChart messages sent after 11:00 AM on Friday will not be received by the provider until Monday morning.    Lab and Test Results: You will receive your lab and test results on MyChart as soon as they are completed and results have been sent by the lab or testing facility. Due to this service, you will receive your results BEFORE your provider.  I review lab and test results each morning prior to seeing patients. Some results require collaboration with other providers to ensure you are receiving the most appropriate care. For this reason, we ask that you please allow a minimum of 3-5 business days from the time that ALL results have been received for your provider to receive and review lab and test results and contact you about these.  Most lab and test result comments from the provider will be sent through MyChart. Your provider may recommend changes to the plan of care, follow-up visits, repeat testing, ask questions, or request an office visit to discuss these results. You may reply directly to this message or call the office to provide information for the provider or set up an appointment. In some instances, you will be called with test results and recommendations. Please let us know if this is preferred and we will make note of this in your chart to provide this for you.    If you have not heard a response to your lab or test results in 5 business days from all results returning to MyChart, please call the office to let us know. We ask  that you please avoid calling prior to this time unless there is an emergent concern. Due to high call volumes, this can delay the resulting process.  After Hours: For all non-emergency after hours needs, please call the office at [phone] and select the option to reach the on-call  service. On-call services are shared between multiple [place] offices and therefore it will not be possible to speak directly with your provider.  On-call providers may provide medical advice and recommendations, but are unable to provide refills for maintenance medications.  For all emergency or urgent medical needs after normal business hours, we recommend that you seek care at the closest Urgent Care or Emergency Department to ensure appropriate treatment in a timely manner.  [facility] has a 24 hour emergency room located on the ground floor for your convenience.   Urgent Concerns During the Business Day Providers are seeing patients from 8AM to 5PM with a busy schedule and are most often not able to respond to non-urgent calls until the end of the day or the next business day. If you should have URGENT concerns during the day, please call and speak to the nurse or schedule a same day appointment so that we can address your concern without delay.   Thank you, again, for choosing me as your health care partner. I appreciate your trust and look forward to learning more about you.   Lollie Marrow Reola Calkins, DNP, FNP-C  ___________________________________________________________  Health Maintenance Recommendations Screening Testing Mammogram Every 1-2 years based on history and risk factors Starting at age 23 Pap Smear Ages 21-39 every 3 years Ages 67-65 every 5 years with HPV testing More frequent testing may be required based on results and history Colon Cancer Screening Every 1-10 years based on test performed, risk factors, and history Starting at age 56 Bone Density Screening Every 2-10 years based on history Starting at age 64 for women Recommendations for men differ based on medication usage, history, and risk factors AAA Screening One time ultrasound Men 48-52 years old who have ever smoked Lung Cancer Screening Low Dose Lung CT every 12 months Age 75-80 years with a 20 pack-year smoking history who still smoke or who have quit within the last 15 years  Screening Labs Routine  Labs: Complete Blood  Count (CBC), Complete Metabolic Panel (CMP), Cholesterol (Lipid Panel) Every 6-12 months based on history and medications May be recommended more frequently based on current conditions or previous results Hemoglobin A1c Lab Every 3-12 months based on history and previous results Starting at age 23 or earlier with diagnosis of diabetes, high cholesterol, BMI >26, and/or risk factors Frequent monitoring for patients with diabetes to ensure blood sugar control Thyroid Panel (TSH w/ T3 & T4) Every 6 months based on history, symptoms, and risk factors May be repeated more often if on medication HIV One time testing for all patients 18 and older May be repeated more frequently for patients with increased risk factors or exposure Hepatitis C One time testing for all patients 28 and older May be repeated more frequently for patients with increased risk factors or exposure Gonorrhea, Chlamydia Every 12 months for all sexually active persons 13-24 years Additional monitoring may be recommended for those who are considered high risk or who have symptoms PSA Men 65-16 years old with risk factors Additional screening may be recommended from age 67-69 based on risk factors, symptoms, and history  Vaccine Recommendations Tetanus Booster All adults every 10 years Flu Vaccine All  patients 6 months and older every year COVID Vaccine All patients 12 years and older Initial dosing with booster May recommend additional booster based on age and health history HPV Vaccine 2 doses all patients age 36-26 Dosing may be considered for patients over 26 Shingles Vaccine (Shingrix) 2 doses all adults 50 years and older Pneumonia (Pneumovax 23) All adults 65 years and older May recommend earlier dosing based on health history Pneumonia (Prevnar 71) All adults 65 years and older Dosed 1 year after Pneumovax 23 Pneumonia (Prevnar 20) All adults 65 years and older (adults 19-64 with certain conditions or  risk factors) 1 dose  For those who have no received Prevnar 13 vaccine previously   Additional Screening, Testing, and Vaccinations may be recommended on an individualized basis based on family history, health history, risk factors, and/or exposure.  __________________________________________________________  Diet Recommendations for All Patients  I recommend that all patients maintain a diet low in saturated fats, carbohydrates, and cholesterol. While this can be challenging at first, it is not impossible and small changes can make big differences.  Things to try: Decreasing the amount of soda, sweet tea, and/or juice to one or less per day and replace with water While water is always the first choice, if you do not like water you may consider adding a water additive without sugar to improve the taste other sugar free drinks Replace potatoes with a brightly colored vegetable at dinner Use healthy oils, such as canola oil or olive oil, instead of butter or hard margarine Limit your bread intake to two pieces or less a day Replace regular pasta with low carb pasta options Bake, broil, or grill foods instead of frying Monitor portion sizes  Eat smaller, more frequent meals throughout the day instead of large meals  An important thing to remember is, if you love foods that are not great for your health, you don't have to give them up completely. Instead, allow these foods to be a reward when you have done well. Allowing yourself to still have special treats every once in a while is a nice way to tell yourself thank you for working hard to keep yourself healthy.   Also remember that every day is a new day. If you have a bad day and "fall off the wagon", you can still climb right back up and keep moving along on your journey!  We have resources available to help you!  Some websites that may be helpful  include: www.http://www.wall-moore.info/  Www.VeryWellFit.com _____________________________________________________________  Activity Recommendations for All Patients  I recommend that all adults get at least 20 minutes of moderate physical activity that elevates your heart rate at least 5 days out of the week.  Some examples include: Walking or jogging at a pace that allows you to carry on a conversation Cycling (stationary bike or outdoors) Water aerobics Yoga Weight lifting Dancing If physical limitations prevent you from putting stress on your joints, exercise in a pool or seated in a chair are excellent options.  Do determine your MAXIMUM heart rate for activity: YOUR AGE - 220 = MAX HeartRate   Remember! Do not push yourself too hard.  Start slowly and build up your pace, speed, weight, time in exercise, etc.  Allow your body to rest between exercise and get good sleep. You will need more water than normal when you are exerting yourself. Do not wait until you are thirsty to drink. Drink with a purpose of getting in at least 8, 8 ounce  glasses of water a day plus more depending on how much you exercise and sweat.    If you begin to develop dizziness, chest pain, abdominal pain, jaw pain, shortness of breath, headache, vision changes, lightheadedness, or other concerning symptoms, stop the activity and allow your body to rest. If your symptoms are severe, seek emergency evaluation immediately. If your symptoms are concerning, but not severe, please let us know so that we can recommend further evaluation.

## 2022-01-22 ENCOUNTER — Ambulatory Visit: Payer: Medicare Other | Admitting: Neurology

## 2022-02-25 ENCOUNTER — Encounter: Payer: Self-pay | Admitting: Neurology

## 2022-02-25 ENCOUNTER — Ambulatory Visit (INDEPENDENT_AMBULATORY_CARE_PROVIDER_SITE_OTHER): Payer: Medicare Other | Admitting: Neurology

## 2022-02-25 VITALS — BP 145/71 | HR 61 | Ht 65.0 in | Wt 146.8 lb

## 2022-02-25 DIAGNOSIS — G40009 Localization-related (focal) (partial) idiopathic epilepsy and epileptic syndromes with seizures of localized onset, not intractable, without status epilepticus: Secondary | ICD-10-CM | POA: Diagnosis not present

## 2022-02-25 DIAGNOSIS — G250 Essential tremor: Secondary | ICD-10-CM

## 2022-02-25 DIAGNOSIS — R066 Hiccough: Secondary | ICD-10-CM

## 2022-02-25 MED ORDER — LEVETIRACETAM 1000 MG PO TABS: 1000.0000 mg | ORAL_TABLET | Freq: Two times a day (BID) | ORAL | 3 refills | Status: DC
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
## 2022-03-25 ENCOUNTER — Ambulatory Visit (INDEPENDENT_AMBULATORY_CARE_PROVIDER_SITE_OTHER): Payer: Medicare Other

## 2022-03-25 DIAGNOSIS — Z0000 Encounter for general adult medical examination without abnormal findings: Secondary | ICD-10-CM | POA: Diagnosis not present

## 2022-03-25 NOTE — Progress Notes (Signed)
? ?Subjective:  ? Cole Bond is a 71 y.o. male who presents for an Initial Medicare Annual Wellness Visit. ? ?I connected with  Cole Bond on 03/25/22 by a audio enabled telemedicine application and verified that I am speaking with the correct person using two identifiers. ? ?Patient Location: Home ? ?Provider Location: Office/Clinic ? ?I discussed the limitations of evaluation and management by telemedicine. The patient expressed understanding and agreed to proceed.  ?Review of Systems    ? ?Cardiac Risk Factors include: advanced age (>40men, >49 women) ? ?   ?Objective:  ?  ?There were no vitals filed for this visit. ?There is no height or weight on file to calculate BMI. ? ? ?  03/25/2022  ?  3:42 PM 02/25/2022  ?  2:28 PM 01/24/2021  ?  8:39 AM 06/21/2020  ?  3:53 PM 08/18/2019  ?  1:31 PM 06/30/2019  ? 12:51 PM  ?Advanced Directives  ?Does Patient Have a Medical Advance Directive? Yes Yes Yes Yes Yes Yes  ?Type of Estate agent of [employer];Living will Living will Healthcare Power of [place];Out of facility DNR (pink MOST or yellow form);Living will Healthcare Power of [place];Living will;Out of facility DNR (pink MOST or yellow form) Living will;Healthcare Power of [employer] Power of Attorney  ?Copy of Healthcare Power of Attorney in Chart? No - copy requested       ? ? ?Current Medications (verified) ?Outpatient Encounter Medications as of 03/25/2022  ?Medication Sig  ? aspirin 325 MG tablet Take 325 mg by mouth daily.   ? atorvastatin (LIPITOR) 20 MG tablet Take 20 mg by mouth daily.  ? Ferrous Sulfate (IRON PO) Take 45 mg by mouth daily.  ? gabapentin (NEURONTIN) 100 MG capsule Take 1 capsule (100 mg total) by mouth 3 (three) times daily.  ? hydrochlorothiazide (MICROZIDE) 12.5 MG capsule Take 12.5 mg by mouth daily.   ? levETIRAcetam (KEPPRA) 1000 MG tablet Take 1 tablet (1,000 mg total) by mouth 2 (two) times daily.  ? lisinopril (PRINIVIL,ZESTRIL) 20 MG tablet Take 20  mg by mouth daily.   ? ?No facility-administered encounter medications on file as of 03/25/2022.  ? ? ?Allergies (verified) ?Patient has no known allergies.  ? ?History: ?Past Medical History:  ?Diagnosis Date  ? Hyperlipemia   ? Hypertension   ? Seizures (HCC)   ? ?Past Surgical History:  ?Procedure Laterality Date  ? APPENDECTOMY    ? EYE SURGERY    ? ?Family History  ?Problem Relation Age of Onset  ? Breast cancer Mother   ? Cancer Mother   ? Stroke Father   ? ?Social History  ? ?Socioeconomic History  ? Marital status: Married  ?  Spouse name: Not on file  ? Number of children: Not on file  ? Years of education: Not on file  ? Highest education level: Not on file  ?Occupational History  ? Not on file  ?Tobacco Use  ? Smoking status: Former  ?  Packs/day: 1.00  ?  Years: 20.00  ?  Pack years: 20.00  ?  Types: Cigarettes, E-cigarettes  ?  Quit date: 05/01/2013  ?  Years since quitting: 8.9  ? Smokeless tobacco: Never  ?Vaping Use  ? Vaping Use: Never used  ?Substance and Sexual Activity  ? Alcohol use: Yes  ?  Alcohol/week: 3.0 standard drinks  ?  Types: 3 Cans of beer per week  ? Drug use: No  ? Sexual activity: Not Currently  ?  Partners: Female  ?Other Topics Concern  ? Not on file  ?Social History Narrative  ? Lives with wife in one story home  ?   ? Right haned  ?   ? Highest level of edu- ged   ?   ? Caffeine: Yes  ?   ? ?Social Determinants of Health  ? ?Financial Resource Strain: Not on file  ?Food Insecurity: Not on file  ?Transportation Needs: Not on file  ?Physical Activity: Not on file  ?Stress: Not on file  ?Social Connections: Not on file  ? ? ?Tobacco Counseling ?Counseling given: Not Answered ? ? ?Clinical Intake: ? ?Pre-visit preparation completed: Yes ? ?Pain : No/denies pain ? ?  ? ?Nutritional Risks: None ?Diabetes: No ? ?How often do you need to have someone help you when you read instructions, pamphlets, or other written materials from your doctor or pharmacy?: 1 -  Never ? ?Diabetic?No ? ?Interpreter Needed?: No ? ?Information entered by :: Azora Bonzo ? ? ?Activities of Daily Living ? ?  03/25/2022  ?  3:43 PM  ?In your present state of health, do you have any difficulty performing the following activities:  ?Hearing? 0  ?Vision? 0  ?Difficulty concentrating or making decisions? 0  ?Walking or climbing stairs? 0  ?Dressing or bathing? 0  ?Doing errands, shopping? 0  ?Preparing Food and eating ? N  ?Using the Toilet? N  ?In the past six months, have you accidently leaked urine? N  ?Do you have problems with loss of bowel control? N  ?Managing your Medications? N  ?Managing your Finances? N  ?Housekeeping or managing your Housekeeping? N  ? ? ?Patient Care Team: ?Clayborne Dana, NP as PCP - General (Family Medicine) ?Van Clines, MD as Consulting Physician (Neurology) ? ?Indicate any recent Medical Services you may have received from other than [facility] providers in the past year (date may be approximate). ? ?   ?Assessment:  ? This is a routine wellness examination for Cole Bond. ? ?Hearing/Vision screen ?No results found. ? ?Dietary issues and exercise activities discussed: ?Current Exercise Habits: Home exercise routine, Type of exercise: walking;stretching, Time (Minutes): 60, Frequency (Times/Week): 7, Weekly Exercise (Minutes/Week): 420, Intensity: Mild ? ? Goals Addressed   ?None ?  ? ?Depression Screen ? ?  03/25/2022  ?  3:42 PM 01/17/2022  ? 10:00 AM  ?PHQ 2/9 Scores  ?PHQ - 2 Score 0 0  ?  ?Fall Risk ? ?  03/25/2022  ?  3:42 PM 02/25/2022  ?  2:28 PM 01/17/2022  ? 10:00 AM 01/24/2021  ?  8:38 AM 06/21/2020  ?  3:52 PM  ?Fall Risk   ?Falls in the past year? 0 0 0 0 0  ?Number falls in past yr: 0 0 0 0 0  ?Injury with Fall? 0 0 0 0 0  ?Risk for fall due to : No Fall Risks  No Fall Risks    ?Follow up Falls evaluation completed  Falls evaluation completed    ? ? ?FALL RISK PREVENTION PERTAINING TO THE HOME: ? ?Any stairs in or around the home? Yes  ?If so, are there any without  handrails? No  ?Home free of loose throw rugs in walkways, pet beds, electrical cords, etc? Yes  ?Adequate lighting in your home to reduce risk of falls? Yes  ? ?ASSISTIVE DEVICES UTILIZED TO PREVENT FALLS: ? ?Life alert? No  ?Use of a cane, walker or w/c? No  ?Grab bars in the bathroom? No  ?  Shower chair or bench in shower? No  ?Elevated toilet seat or a handicapped toilet? No  ? ?TIMED UP AND GO: ? ?Was the test performed? No .  ? ? ?Cognitive Function: ? ?  12/11/2018  ? 10:00 AM 04/29/2018  ? 12:00 PM  ?MMSE - Mini Mental State Exam  ?Orientation to time 5 5  ?Orientation to Place 5 5  ?Registration 3 3  ?Attention/ Calculation 5 5  ?Recall 2 2  ?Language- name 2 objects 2 2  ?Language- repeat 1 1  ?Language- follow 3 step command 3 3  ?Language- read & follow direction 1 1  ?Write a sentence 1 1  ?Copy design 1 1  ?Total score 29 29  ? ?  ? ?  03/25/2022  ?  3:45 PM  ?6CIT Screen  ?What Year? 0 points  ?What month? 0 points  ?What time? 0 points  ?Count back from 20 0 points  ?Months in reverse 0 points  ?Repeat phrase 2 points  ?Total Score 2 points  ? ? ?Immunizations ?Immunization History  ?Administered Date(s) Administered  ? Influenza,inj,Quad PF,6+ Mos 10/28/2016  ? Influenza-Unspecified 10/28/2016  ? Pneumococcal Conjugate-13 10/28/2016  ? ? ?TDAP status: Due, Education has been provided regarding the importance of this vaccine. Advised may receive this vaccine at local pharmacy or Health Dept. Aware to provide a copy of the vaccination record if obtained from local pharmacy or Health Dept. Verbalized acceptance and understanding. ? ?Flu Vaccine status: Declined, Education has been provided regarding the importance of this vaccine but patient still declined. Advised may receive this vaccine at local pharmacy or Health Dept. Aware to provide a copy of the vaccination record if obtained from local pharmacy or Health Dept. Verbalized acceptance and understanding. ? ?Pneumococcal vaccine status: Declined,   Education has been provided regarding the importance of this vaccine but patient still declined. Advised may receive this vaccine at local pharmacy or Health Dept. Aware to provide a copy of the vaccination record if obtained from local pharm

## 2022-03-25 NOTE — Patient Instructions (Signed)
Cole Bond , ?Thank you for taking time to come for your Medicare Wellness Visit. I appreciate your ongoing commitment to your health goals. Please review the following plan we discussed and let me know if I can assist you in the future.  ? ?Screening recommendations/referrals: ?Colonoscopy: 12/09/15 due 12/08/25 ?Recommended yearly ophthalmology/optometry visit for glaucoma screening and checkup ?Recommended yearly dental visit for hygiene and checkup ? ?Vaccinations: ?Influenza vaccine: declined ?Pneumococcal vaccine: declined ?Tdap vaccine: declined ?Shingles vaccine: declined   ?Covid-19: declined ? ?Advanced directives: yes, not on file ? ?Conditions/risks identified: see problem list  ? ?Next appointment: Follow up in one year for your annual wellness visit.  ? ?Preventive Care 46 Years and Older, Male ?Preventive care refers to lifestyle choices and visits with your health care provider that can promote health and wellness. ?What does preventive care include? ?A yearly physical exam. This is also called an annual well check. ?Dental exams once or twice a year. ?Routine eye exams. Ask your health care provider how often you should have your eyes checked. ?Personal lifestyle choices, including: ?Daily care of your teeth and gums. ?Regular physical activity. ?Eating a healthy diet. ?Avoiding tobacco and drug use. ?Limiting alcohol use. ?Practicing safe sex. ?Taking low doses of aspirin every day. ?Taking vitamin and mineral supplements as recommended by your health care provider. ?What happens during an annual well check? ?The services and screenings done by your health care provider during your annual well check will depend on your age, overall health, lifestyle risk factors, and family history of disease. ?Counseling  ?Your health care provider may ask you questions about your: ?Alcohol use. ?Tobacco use. ?Drug use. ?Emotional well-being. ?Home and relationship well-being. ?Sexual activity. ?Eating  habits. ?History of falls. ?Memory and ability to understand (cognition). ?Work and work Statistician. ?Screening  ?You may have the following tests or measurements: ?Height, weight, and BMI. ?Blood pressure. ?Lipid and cholesterol levels. These may be checked every 5 years, or more frequently if you are over 54 years old. ?Skin check. ?Lung cancer screening. You may have this screening every year starting at age 28 if you have a 30-pack-year history of smoking and currently smoke or have quit within the past 15 years. ?Fecal occult blood test (FOBT) of the stool. You may have this test every year starting at age 48. ?Flexible sigmoidoscopy or colonoscopy. You may have a sigmoidoscopy every 5 years or a colonoscopy every 10 years starting at age 69. ?Prostate cancer screening. Recommendations will vary depending on your family history and other risks. ?Hepatitis C blood test. ?Hepatitis B blood test. ?Sexually transmitted disease (STD) testing. ?Diabetes screening. This is done by checking your blood sugar (glucose) after you have not eaten for a while (fasting). You may have this done every 1-3 years. ?Abdominal aortic aneurysm (AAA) screening. You may need this if you are a current or former smoker. ?Osteoporosis. You may be screened starting at age 79 if you are at high risk. ?Talk with your health care provider about your test results, treatment options, and if necessary, the need for more tests. ?Vaccines  ?Your health care provider may recommend certain vaccines, such as: ?Influenza vaccine. This is recommended every year. ?Tetanus, diphtheria, and acellular pertussis (Tdap, Td) vaccine. You may need a Td booster every 10 years. ?Zoster vaccine. You may need this after age 10. ?Pneumococcal 13-valent conjugate (PCV13) vaccine. One dose is recommended after age 67. ?Pneumococcal polysaccharide (PPSV23) vaccine. One dose is recommended after age 33. ?Talk to your health  care provider about which screenings and  vaccines you need and how often you need them. ?This information is not intended to replace advice given to you by your health care provider. Make sure you discuss any questions you have with your health care provider. ?Document Released: 12/08/2015 Document Revised: 07/31/2016 Document Reviewed: 09/12/2015 ?Elsevier Interactive Patient Education ? 2017 [employer]. ? ?Fall Prevention in the Home ?Falls can cause injuries. They can happen to people of all ages. There are many things you can do to make your home safe and to help prevent falls. ?What can I do on the outside of my home? ?Regularly fix the edges of walkways and driveways and fix any cracks. ?Remove anything that might make you trip as you walk through a door, such as a raised step or threshold. ?Trim any bushes or trees on the path to your home. ?Use bright outdoor lighting. ?Clear any walking paths of anything that might make someone trip, such as rocks or tools. ?Regularly check to see if handrails are loose or broken. Make sure that both sides of any steps have handrails. ?Any raised decks and porches should have guardrails on the edges. ?Have any leaves, snow, or ice cleared regularly. ?Use sand or salt on walking paths during winter. ?Clean up any spills in your garage right away. This includes oil or grease spills. ?What can I do in the bathroom? ?Use night lights. ?Install grab bars by the toilet and in the tub and shower. Do not use towel bars as grab bars. ?Use non-skid mats or decals in the tub or shower. ?If you need to sit down in the shower, use a plastic, non-slip stool. ?Keep the floor dry. Clean up any water that spills on the floor as soon as it happens. ?Remove soap buildup in the tub or shower regularly. ?Attach bath mats securely with double-sided non-slip rug tape. ?Do not have throw rugs and other things on the floor that can make you trip. ?What can I do in the bedroom? ?Use night lights. ?Make sure that you have a light by your  bed that is easy to reach. ?Do not use any sheets or blankets that are too big for your bed. They should not hang down onto the floor. ?Have a firm chair that has side arms. You can use this for support while you get dressed. ?Do not have throw rugs and other things on the floor that can make you trip. ?What can I do in the kitchen? ?Clean up any spills right away. ?Avoid walking on wet floors. ?Keep items that you use a lot in easy-to-reach places. ?If you need to reach something above you, use a strong step stool that has a grab bar. ?Keep electrical cords out of the way. ?Do not use floor polish or wax that makes floors slippery. If you must use wax, use non-skid floor wax. ?Do not have throw rugs and other things on the floor that can make you trip. ?What can I do with my stairs? ?Do not leave any items on the stairs. ?Make sure that there are handrails on both sides of the stairs and use them. Fix handrails that are broken or loose. Make sure that handrails are as long as the stairways. ?Check any carpeting to make sure that it is firmly attached to the stairs. Fix any carpet that is loose or worn. ?Avoid having throw rugs at the top or bottom of the stairs. If you do have throw rugs, attach them to  the floor with carpet tape. ?Make sure that you have a light switch at the top of the stairs and the bottom of the stairs. If you do not have them, ask someone to add them for you. ?What else can I do to help prevent falls? ?Wear shoes that: ?Do not have high heels. ?Have rubber bottoms. ?Are comfortable and fit you well. ?Are closed at the toe. Do not wear sandals. ?If you use a stepladder: ?Make sure that it is fully opened. Do not climb a closed stepladder. ?Make sure that both sides of the stepladder are locked into place. ?Ask someone to hold it for you, if possible. ?Clearly mark and make sure that you can see: ?Any grab bars or handrails. ?First and last steps. ?Where the edge of each step is. ?Use tools that  help you move around (mobility aids) if they are needed. These include: ?Canes. ?Walkers. ?Scooters. ?Crutches. ?Turn on the lights when you go into a dark area. Replace any light bulbs as soon as they burn out. ?S

## 2022-04-16 ENCOUNTER — Ambulatory Visit (INDEPENDENT_AMBULATORY_CARE_PROVIDER_SITE_OTHER): Payer: Medicare Other | Admitting: Family Medicine

## 2022-04-16 ENCOUNTER — Encounter: Payer: Self-pay | Admitting: Family Medicine

## 2022-04-16 VITALS — BP 130/59 | HR 63 | Temp 98.2°F | Resp 88 | Ht 65.0 in | Wt 147.8 lb

## 2022-04-16 DIAGNOSIS — E785 Hyperlipidemia, unspecified: Secondary | ICD-10-CM | POA: Insufficient documentation

## 2022-04-16 DIAGNOSIS — Z8639 Personal history of other endocrine, nutritional and metabolic disease: Secondary | ICD-10-CM

## 2022-04-16 DIAGNOSIS — E782 Mixed hyperlipidemia: Secondary | ICD-10-CM | POA: Diagnosis not present

## 2022-04-16 DIAGNOSIS — I10 Essential (primary) hypertension: Secondary | ICD-10-CM

## 2022-04-16 LAB — IBC + FERRITIN
Ferritin: 62.6 ng/mL (ref 22.0–322.0)
Iron: 134 ug/dL (ref 42–165)
Saturation Ratios: 39.9 % (ref 20.0–50.0)
TIBC: 336 ug/dL (ref 250.0–450.0)
Transferrin: 240 mg/dL (ref 212.0–360.0)

## 2022-04-16 LAB — LIPID PANEL
Cholesterol: 128 mg/dL (ref 0–200)
HDL: 48.6 mg/dL (ref >39.00)
LDL Cholesterol: 66 mg/dL (ref 0–99)
NonHDL: 79.36
Total CHOL/HDL Ratio: 3
Triglycerides: 65 mg/dL (ref 0.0–149.0)
VLDL: 13 mg/dL (ref 0.0–40.0)

## 2022-04-16 LAB — COMPREHENSIVE METABOLIC PANEL
ALT: 13 U/L (ref 0–53)
AST: 18 U/L (ref 0–37)
Albumin: 4.4 g/dL (ref 3.5–5.2)
Alkaline Phosphatase: 67 U/L (ref 39–117)
BUN: 10 mg/dL (ref 6–23)
CO2: 34 mEq/L — ABNORMAL HIGH (ref 19–32)
Calcium: 9.5 mg/dL (ref 8.4–10.5)
Chloride: 98 mEq/L (ref 96–112)
Creatinine, Ser: 0.95 mg/dL (ref 0.40–1.50)
GFR: 80.9 mL/min (ref >60.00)
Glucose, Bld: 99 mg/dL (ref 70–99)
Potassium: 5 mEq/L (ref 3.5–5.1)
Sodium: 137 mEq/L (ref 135–145)
Total Bilirubin: 0.6 mg/dL (ref 0.2–1.2)
Total Protein: 6.8 g/dL (ref 6.0–8.3)

## 2022-04-16 LAB — CBC
HCT: 41.5 % (ref 39.0–52.0)
Hemoglobin: 14.1 g/dL (ref 13.0–17.0)
MCHC: 34 g/dL (ref 30.0–36.0)
MCV: 92.9 fl (ref 78.0–100.0)
Platelets: 206 10*3/uL (ref 150.0–400.0)
RBC: 4.47 Mil/uL (ref 4.22–5.81)
RDW: 13.2 % (ref 11.5–15.5)
WBC: 5.6 10*3/uL (ref 4.0–10.5)

## 2022-04-16 LAB — TSH: TSH: 3.27 u[IU]/mL (ref 0.35–5.50)

## 2022-04-16 NOTE — Assessment & Plan Note (Signed)
-  Reviewed most recent lipid panel -Medication management: continue Lipitor 20 mg -Repeat CMP and lipid panel today -Diet low in saturated fat -Regular exercise - at least 30 minutes, 5 times per week

## 2022-04-16 NOTE — Patient Instructions (Signed)
Good to see you today! Continue all prescriptions as is for now. We will update labs today and let you know if we need to make any changes.

## 2022-04-16 NOTE — Progress Notes (Signed)
Established Patient Office Visit  Subjective   Patient ID: Esperanza Sheets, male    DOB: [birth year]  Age: 71 y.o. MRN: [id number]  Chief Complaint  Patient presents with   Follow-up    3 Month follow up    HPI Patient reports he is doing well overall. No complaints or concerns to address today. He is still staying busy working.   HYPERTENSION: - Medications: lisinopril 20 mg daily, HCTZ 12.5 mg daily - Compliance: good - Checking BP at home: no - Denies any SOB, recurrent headaches, CP, vision changes, LE edema, dizziness, palpitations, or medication side effects. - Diet: general - Exercise: walking at work    HYPERLIPIDEMIA - medications: Lipitor 20 mg daily - compliance: good - medication SEs: no The 10-year ASCVD risk score (Arnett DK, et al., 2019) is: 19.7%   Values used to calculate the score:     Age: 38 years     Sex: Male     Is Non-Hispanic African American: No     Diabetic: No     Tobacco smoker: No     Systolic Blood Pressure: 130 mmHg     Is BP treated: Yes     HDL Cholesterol: 48 MG/DL     Total Cholesterol: 163 MG/DL   He still has hiccups daily. The gabapentin trial did not make any difference, so he does not want to continue. His neurologist is following for this as well as his hx of seizures. On keppra. No recent seizure-like activity.  Hx of iron deficiency but he has not been supplementation in the past month. States he did not like the GI side effects. He is willing to recheck iron levels today.       ROS All review of systems negative except what is listed in the HPI    Objective:     BP (!) 130/59 (BP Location: Left Arm)   Pulse 63   Temp 98.2 F (36.8 C) (Oral)   Resp (!) 88   Ht 5\' 5"  (1.651 m)   Wt 147 lb 12.8 oz (67 kg)   SpO2 (!) 9%   BMI 24.60 kg/m    Physical Exam Vitals reviewed.  Constitutional:      Appearance: Normal appearance. He is normal weight.  HENT:     Head: Normocephalic and atraumatic.   Cardiovascular:     Rate and Rhythm: Normal rate and regular rhythm.  Pulmonary:     Effort: Pulmonary effort is normal.     Breath sounds: Normal breath sounds.  Musculoskeletal:     Right lower leg: No edema.     Left lower leg: No edema.  Skin:    General: Skin is warm and dry.  Neurological:     General: No focal deficit present.     Mental Status: He is alert and oriented to person, place, and time. Mental status is at baseline.  Psychiatric:        Mood and Affect: Mood normal.        Behavior: Behavior normal.        Thought Content: Thought content normal.        Judgment: Judgment normal.     No results found for any visits on 04/16/22.    The 10-year ASCVD risk score (Arnett DK, et al., 2019) is: 19.7%    Assessment & Plan:   1. Mixed hyperlipidemia -Reviewed most recent lipid panel -Medication management: continue Lipitor 20 mg -Repeat CMP and lipid panel today -Diet  low in saturated fat -Regular exercise - at least 30 minutes, 5 times per week  - Comprehensive metabolic panel - Lipid panel  2. Hypertension, unspecified type Blood pressure is at goal for age and co-morbidities.  I recommend continuing HCTZ 12.5 mg daily, lisinopril 20 mg daily.   - BP goal <130/80 - monitor and log blood pressures at home - check around the same time each day in a relaxed setting - Limit salt to <2000 mg/day - Follow DASH eating plan (heart healthy diet) - limit alcohol to 2 standard drinks per day for men and 1 per day for women - avoid tobacco products - get at least 2 hours of regular aerobic exercise weekly Patient aware of signs/symptoms requiring further/urgent evaluation. Labs updated today.  - CBC - Comprehensive metabolic panel - Lipid panel - TSH  3. History of iron deficiency Not currently on supplementation. Asymptomatic. Will check labs today.  - CBC - IBC + Ferritin    Return in about 6 months (around 10/17/2022) for routine f/u .     Clayborne Dana, NP

## 2022-04-16 NOTE — Assessment & Plan Note (Signed)
Blood pressure is at goal for age and co-morbidities.  I recommend continuing HCTZ 12.5 mg daily, lisinopril 20 mg daily.   - BP goal <130/80 - monitor and log blood pressures at home - check around the same time each day in a relaxed setting - Limit salt to <2000 mg/day - Follow DASH eating plan (heart healthy diet) - limit alcohol to 2 standard drinks per day for men and 1 per day for women - avoid tobacco products - get at least 2 hours of regular aerobic exercise weekly Patient aware of signs/symptoms requiring further/urgent evaluation. Labs updated today.

## 2022-06-25 ENCOUNTER — Other Ambulatory Visit: Payer: Self-pay | Admitting: *Deleted

## 2022-06-25 ENCOUNTER — Telehealth: Payer: Self-pay | Admitting: Family Medicine

## 2022-06-25 MED ORDER — HYDROCHLOROTHIAZIDE 12.5 MG PO CAPS: 12.5000 mg | ORAL_CAPSULE | Freq: Every day | ORAL | 1 refills | Status: DC

## 2022-06-25 NOTE — Telephone Encounter (Signed)
Refill sent to pharmacy.   

## 2022-06-25 NOTE — Telephone Encounter (Signed)
Patient's wife called to request refill on his medicine. Ladona Ridgel has not prescribed this medication for patient yet.   Medication: Hydrochlorothiazide   Has the patient contacted their pharmacy? No. (If no, request that the patient contact the pharmacy for the refill.) (If yes, when and what did the pharmacy advise?)  [facility] ([facility]  Agent: Please be advised that RX refills may take up to 3 business days. We ask that you follow-up with your pharmacy.

## 2022-08-09 ENCOUNTER — Telehealth: Payer: Self-pay | Admitting: Family Medicine

## 2022-08-09 NOTE — Telephone Encounter (Signed)
Medication: Ketoconazole shampoo (another provider was filling but they no longer see that provider)  Has the patient contacted their pharmacy? No. They no longer see the provider who was filling it for him.   Preferred Pharmacy (with phone number or street name):  [facility] - [place], [place] - [address]  [address], [place] [address]  Phone:  [phone]  Fax:  [phone]    Agent: Please be advised that RX refills may take up to 3 business days. We ask that you follow-up with your pharmacy.

## 2022-08-13 ENCOUNTER — Other Ambulatory Visit: Payer: Self-pay | Admitting: *Deleted

## 2022-08-13 MED ORDER — KETOCONAZOLE 2 % EX SHAM: MEDICATED_SHAMPOO | CUTANEOUS | 1 refills | Status: DC

## 2022-08-13 NOTE — Telephone Encounter (Signed)
Refill sent to pharmacy.   

## 2022-08-15 ENCOUNTER — Other Ambulatory Visit: Payer: Self-pay | Admitting: *Deleted

## 2022-08-15 ENCOUNTER — Telehealth: Payer: Self-pay | Admitting: Family Medicine

## 2022-08-15 MED ORDER — LISINOPRIL 20 MG PO TABS: 20.0000 mg | ORAL_TABLET | Freq: Every day | ORAL | 0 refills | Status: DC

## 2022-08-15 MED ORDER — ATORVASTATIN CALCIUM 20 MG PO TABS: 20.0000 mg | ORAL_TABLET | Freq: Every day | ORAL | 0 refills | Status: DC

## 2022-08-15 NOTE — Telephone Encounter (Signed)
Medication: Lisinopril 20 mg and Atorvastatin 20 mg  Has the patient contacted their pharmacy? No.   Previous provider last prescribed medications for him so they need them to be sent in from [facility] now that they need refills    Preferred Pharmacy (with phone number or street name):  [facility], [place]  [address] [place] [address]  Phone:  [phone]  Fax:  [phone]    Agent: Please be advised that RX refills may take up to 3 business days. We ask that you follow-up with your pharmacy.

## 2022-08-15 NOTE — Telephone Encounter (Signed)
Refills sent to pharmacy. 

## 2022-10-20 NOTE — Progress Notes (Signed)
Established Patient Office Visit  Subjective   Patient ID: Cole Bond, male    DOB: 07-Aug-1951  Age: 71 y.o. MRN: [id number]  Chief Complaint  Patient presents with   Follow-up    Follow up    HPI  Patient is here for routine follow-up to discuss hypertension and hyperlipidemia. He reports he is feeling well and has no acute concerns today. He just recently got back from a week in [place] to see his granddaughter over Thanksgiving.    HYPERTENSION: - Medications: HCTZ 12.5 mg daily, lisinopril 20 mg daily - Compliance: good - Checking BP at home: rarely, usually good  - Denies any SOB, recurrent headaches, CP, vision changes, LE edema, dizziness, palpitations, or medication side effects. - Diet: general - Exercise: very active with work    HYPERLIPIDEMIA - medications: atorvastatin 20 mg daily - compliance: good - medication SEs: none The ASCVD Risk score (Arnett DK, et al., 2019) failed to calculate for the following reasons:   The valid total cholesterol range is 130 to 320 mg/dL       ROS All review of systems negative except what is listed in the HPI    Objective:     BP (!) 132/58 (BP Location: Right Arm, Patient Position: Sitting, Cuff Size: Normal)   Pulse 69   Temp 98 F (36.7 C) (Oral)   Resp 16   Ht 5\' 5"  (1.651 m)   Wt 143 lb 12.8 oz (65.2 kg)   SpO2 98%   BMI 23.93 kg/m    Physical Exam Vitals reviewed.  Constitutional:      Appearance: Normal appearance.  Neck:     Vascular: No carotid bruit.  Cardiovascular:     Rate and Rhythm: Normal rate and regular rhythm.     Pulses: Normal pulses.     Heart sounds: Normal heart sounds.  Pulmonary:     Effort: Pulmonary effort is normal.     Breath sounds: Normal breath sounds.  Musculoskeletal:     Cervical back: No tenderness.     Right lower leg: No edema.     Left lower leg: No edema.  Lymphadenopathy:     Cervical: No cervical adenopathy.  Skin:    General: Skin is warm  and dry.  Neurological:     Mental Status: He is alert and oriented to person, place, and time.  Psychiatric:        Mood and Affect: Mood normal.        Behavior: Behavior normal.        Thought Content: Thought content normal.        Judgment: Judgment normal.        No results found for any visits on 10/21/22.    The ASCVD Risk score (Arnett DK, et al., 2019) failed to calculate for the following reasons:   The valid total cholesterol range is 130 to 320 mg/dL    Assessment & Plan:   Problem List Items Addressed This Visit       Cardiovascular and Mediastinum   Hypertension - Primary (Chronic)    Blood pressure is at goal for age and co-morbidities.   Recommendations: continue HCTZ 12.5 mg daily and lisinopril 20 mg daily  - BP goal <130/80 - monitor and log blood pressures at home - check around the same time each day in a relaxed setting - Limit salt to <2000 mg/day - Follow DASH eating plan (heart healthy diet) - limit alcohol to 2 standard drinks  per day for men and 1 per day for women - avoid tobacco products - get at least 2 hours of regular aerobic exercise weekly Patient aware of signs/symptoms requiring further/urgent evaluation. Labs updated today.      Relevant Orders   CBC   Comprehensive metabolic panel   Lipid panel     Other   Hyperlipemia (Chronic)    -Reviewed most recent lipid panel -Medication management: continue atorvastatin 20 mg daily -Repeat CMP and lipid panel today -Diet low in saturated fat -Regular exercise - at least 30 minutes, 5 times per week      Relevant Orders   CBC   Comprehensive metabolic panel   Lipid panel    Return in about 6 months (around 04/21/2023) for 6 month f/u .    Clayborne Dana, NP

## 2022-10-21 ENCOUNTER — Encounter: Payer: Self-pay | Admitting: Family Medicine

## 2022-10-21 ENCOUNTER — Ambulatory Visit (INDEPENDENT_AMBULATORY_CARE_PROVIDER_SITE_OTHER): Payer: Medicare Other | Admitting: Family Medicine

## 2022-10-21 VITALS — BP 132/58 | HR 69 | Temp 98.0°F | Resp 16 | Ht 65.0 in | Wt 143.8 lb

## 2022-10-21 DIAGNOSIS — I10 Essential (primary) hypertension: Secondary | ICD-10-CM | POA: Diagnosis not present

## 2022-10-21 DIAGNOSIS — E782 Mixed hyperlipidemia: Secondary | ICD-10-CM

## 2022-10-21 LAB — COMPREHENSIVE METABOLIC PANEL
ALT: 14 U/L (ref 0–53)
AST: 17 U/L (ref 0–37)
Albumin: 4.4 g/dL (ref 3.5–5.2)
Alkaline Phosphatase: 69 U/L (ref 39–117)
BUN: 15 mg/dL (ref 6–23)
CO2: 31 mEq/L (ref 19–32)
Calcium: 9.3 mg/dL (ref 8.4–10.5)
Chloride: 98 mEq/L (ref 96–112)
Creatinine, Ser: 0.96 mg/dL (ref 0.40–1.50)
GFR: 79.6 mL/min (ref >60.00)
Glucose, Bld: 97 mg/dL (ref 70–99)
Potassium: 4.7 mEq/L (ref 3.5–5.1)
Sodium: 135 mEq/L (ref 135–145)
Total Bilirubin: 0.3 mg/dL (ref 0.2–1.2)
Total Protein: 6.9 g/dL (ref 6.0–8.3)

## 2022-10-21 LAB — CBC
HCT: 43.5 % (ref 39.0–52.0)
Hemoglobin: 14.6 g/dL (ref 13.0–17.0)
MCHC: 33.5 g/dL (ref 30.0–36.0)
MCV: 93.5 fl (ref 78.0–100.0)
Platelets: 270 10*3/uL (ref 150.0–400.0)
RBC: 4.65 Mil/uL (ref 4.22–5.81)
RDW: 13.6 % (ref 11.5–15.5)
WBC: 7.5 10*3/uL (ref 4.0–10.5)

## 2022-10-21 LAB — LIPID PANEL
Cholesterol: 141 mg/dL (ref 0–200)
HDL: 49.4 mg/dL (ref >39.00)
LDL Cholesterol: 79 mg/dL (ref 0–99)
NonHDL: 91.16
Total CHOL/HDL Ratio: 3
Triglycerides: 59 mg/dL (ref 0.0–149.0)
VLDL: 11.8 mg/dL (ref 0.0–40.0)

## 2022-10-21 NOTE — Patient Instructions (Addendum)
Good to see you today! I'm glad you had a good trip over Thanksgiving.   Blood pressure is at goal for age and co-morbidities.   Recommendations: continue HCTZ 12.5 mg daily and lisinopril 20 mg daily  - BP goal <130/80 - monitor and log blood pressures at home - check around the same time each day in a relaxed setting - Limit salt to <2000 mg/day - Follow DASH eating plan (heart healthy diet) - limit alcohol to 2 standard drinks per day for men and 1 per day for women - avoid tobacco products - get at least 2 hours of regular aerobic exercise weekly Patient aware of signs/symptoms requiring further/urgent evaluation. Labs updated today.  Cholesterol management: -Reviewed most recent lipid panel -Medication management: continue atorvastatin 20 mg daily -Repeat CMP and lipid panel today -Diet low in saturated fat -Regular exercise - at least 30 minutes, 5 times per week

## 2022-10-21 NOTE — Assessment & Plan Note (Signed)
-  Reviewed most recent lipid panel -Medication management: continue atorvastatin 20 mg daily -Repeat CMP and lipid panel today -Diet low in saturated fat -Regular exercise - at least 30 minutes, 5 times per week

## 2022-10-21 NOTE — Assessment & Plan Note (Signed)
Blood pressure is at goal for age and co-morbidities.   Recommendations: continue HCTZ 12.5 mg daily and lisinopril 20 mg daily  - BP goal <130/80 - monitor and log blood pressures at home - check around the same time each day in a relaxed setting - Limit salt to <2000 mg/day - Follow DASH eating plan (heart healthy diet) - limit alcohol to 2 standard drinks per day for men and 1 per day for women - avoid tobacco products - get at least 2 hours of regular aerobic exercise weekly Patient aware of signs/symptoms requiring further/urgent evaluation. Labs updated today.

## 2022-10-22 ENCOUNTER — Other Ambulatory Visit: Payer: Self-pay | Admitting: Family Medicine

## 2022-11-17 ENCOUNTER — Other Ambulatory Visit: Payer: Self-pay | Admitting: Family Medicine

## 2022-11-18 ENCOUNTER — Other Ambulatory Visit: Payer: Self-pay | Admitting: Family Medicine

## 2023-01-27 ENCOUNTER — Ambulatory Visit (INDEPENDENT_AMBULATORY_CARE_PROVIDER_SITE_OTHER): Payer: Medicare Other | Admitting: Neurology

## 2023-01-27 ENCOUNTER — Encounter: Payer: Self-pay | Admitting: Neurology

## 2023-01-27 VITALS — BP 134/79 | HR 62 | Ht 65.0 in | Wt 146.2 lb

## 2023-01-27 DIAGNOSIS — R066 Hiccough: Secondary | ICD-10-CM

## 2023-01-27 DIAGNOSIS — G250 Essential tremor: Secondary | ICD-10-CM | POA: Diagnosis not present

## 2023-01-27 DIAGNOSIS — G40009 Localization-related (focal) (partial) idiopathic epilepsy and epileptic syndromes with seizures of localized onset, not intractable, without status epilepticus: Secondary | ICD-10-CM | POA: Diagnosis not present

## 2023-01-27 MED ORDER — METOCLOPRAMIDE HCL 10 MG PO TABS: ORAL_TABLET | ORAL | 0 refills | Status: DC

## 2023-01-27 MED ORDER — LEVETIRACETAM 1000 MG PO TABS: 1000.0000 mg | ORAL_TABLET | Freq: Two times a day (BID) | ORAL | 3 refills | Status: DC
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
## 2023-03-07 ENCOUNTER — Telehealth: Payer: Self-pay | Admitting: Family Medicine

## 2023-03-07 NOTE — Telephone Encounter (Signed)
Pt called stating that he has been having issues with a hacking cough for the past week and was wondering if Ladona Ridgel could call something in for him. Advised pt that it probably would be best to send a MyChart message to her to look into this. Pt declined stating that he is not tech literate and asked if the message could be sent back.

## 2023-03-07 NOTE — Telephone Encounter (Signed)
Sent pt mychart regarding

## 2023-03-10 ENCOUNTER — Ambulatory Visit (INDEPENDENT_AMBULATORY_CARE_PROVIDER_SITE_OTHER): Payer: Medicare Other | Admitting: Family Medicine

## 2023-03-10 ENCOUNTER — Encounter: Payer: Self-pay | Admitting: Family Medicine

## 2023-03-10 VITALS — BP 135/57 | HR 57 | Temp 97.8°F | Ht 65.0 in | Wt 143.0 lb

## 2023-03-10 DIAGNOSIS — R051 Acute cough: Secondary | ICD-10-CM

## 2023-03-10 MED ORDER — CETIRIZINE HCL 10 MG PO TABS: 10.0000 mg | ORAL_TABLET | Freq: Every day | ORAL | 11 refills | Status: DC

## 2023-03-10 MED ORDER — BENZONATATE 200 MG PO CAPS: 200.0000 mg | ORAL_CAPSULE | Freq: Two times a day (BID) | ORAL | 0 refills | Status: DC | PRN

## 2023-03-10 NOTE — Progress Notes (Signed)
Acute Office Visit  Subjective:     Patient ID: Cole Bond, male    DOB: [birth year], 72 y.o.   MRN: [id number]  Chief Complaint  Patient presents with   Cough     Patient is in today for cough.   He states he has had a cough for about a week. Occurring multiple times per day with occasional clear sputum. He has had some mild rhinorrhea and congestion, but no other symptoms. States he doesn't usually struggle with spring allergies, but has noted more drainage lately. So far he has tried some Mucinex and homemade honey-lemon cough syrup with some improvement. States he has been having to prop up to sleep due to the drainage. He denies any fevers, chills, body aches, chest pain, dyspnea, wheezing, sore throat, sneezing, ear pain/pressure, fatigue. No known sick contacts.       All review of systems negative except what is listed in the HPI      Objective:    BP (!) 135/57   Pulse (!) 57   Temp 97.8 F (36.6 C) (Oral)   Ht 5\' 5"  (1.651 m)   Wt 143 lb (64.9 kg)   SpO2 97%   BMI 23.80 kg/m    Physical Exam Vitals reviewed.  Constitutional:      Appearance: Normal appearance.  HENT:     Head: Normocephalic and atraumatic.     Nose: Rhinorrhea present.     Mouth/Throat:     Mouth: Mucous membranes are moist.     Pharynx: Oropharynx is clear. No oropharyngeal exudate or posterior oropharyngeal erythema.     Comments: [place], PND Cardiovascular:     Rate and Rhythm: Normal rate and regular rhythm.     Pulses: Normal pulses.     Heart sounds: Normal heart sounds.  Pulmonary:     Effort: Pulmonary effort is normal.     Breath sounds: Normal breath sounds. No wheezing, rhonchi or rales.  Skin:    General: Skin is warm and dry.  Neurological:     Mental Status: He is alert and oriented to person, place, and time.  Psychiatric:        Mood and Affect: Mood normal.        Behavior: Behavior normal.        Thought Content: Thought content normal.         Judgment: Judgment normal.     No results found for any visits on 03/10/23.      Assessment & Plan:   Problem List Items Addressed This Visit   None Visit Diagnoses     Acute cough    -  Primary Cough aggravated by allergies, drainage.  Goal is to not cough or clear your throat.  Avoid coughing or clearing throat by using non-mint. products/sugarless candy, water, ice chips. Remember NO MINT PRODUCTS Mucinex DM 1-2 every 12 hrs or Delsym 2 tsp every 12 hrs for cough Tessalon 2 times a day as needed for cough.  Zyrtec 10mg  at bedtime  Patient aware of signs/symptoms requiring further/urgent evaluation.      Relevant Medications   benzonatate (TESSALON) 200 MG capsule   cetirizine (ZYRTEC) 10 MG tablet       Meds ordered this encounter  Medications   benzonatate (TESSALON) 200 MG capsule    Sig: Take 1 capsule (200 mg total) by mouth 2 (two) times daily as needed for cough.    Dispense:  20 capsule    Refill:  0  Order Specific Question:   Supervising Provider    Answer:   Danise Edge A [[id number]]   cetirizine (ZYRTEC) 10 MG tablet    Sig: Take 1 tablet (10 mg total) by mouth daily.    Dispense:  30 tablet    Refill:  11    Order Specific Question:   Supervising Provider    Answer:   Danise Edge A [[id number]]    Return if symptoms worsen or fail to improve.  Clayborne Dana, NP

## 2023-03-10 NOTE — Patient Instructions (Addendum)
Cough aggravated by allergies, drainage.  Goal is to not cough or clear your throat.  Avoid coughing or clearing throat by using non-mint. products/sugarless candy, water, ice chips. Remember NO MINT PRODUCTS Mucinex DM 1-2 every 12 hrs or Delsym 2 tsp every 12 hrs for cough Tessalon 2 times a day as needed for cough.  Zyrtec 10mg  at bedtime  Please contact office for follow-up if symptoms do not improve or worsen. Seek emergency care if symptoms become severe.  The following information is provided as a Counsellor for ADULT patients only and does NOT take into account PREGNANCY, ALLERGIES, LIVER CONDITIONS, KIDNEY CONDITIONS, GASTROINTESTINAL CONDITIONS, OR PRESCRIPTION MEDICATION INTERACTIONS. Please be sure to ask your provider if the following are safe to take with your specific medical history, conditions, or current medication regimen if you are unsure.   Adult Basic Symptom Management   Congestion: Guaifenesin (Mucinex)- follow directions on packaging with a maximum dose of 2400mg  in a 24 hour period.  Pain/Fever: Ibuprofen 200mg  - 400mg  every 4-6 hours as needed (MAX 1200mg  in a 24 hour period) Pain/Fever: Tylenol 500mg  -1000mg  every 6-8 hours as needed (MAX 3000mg  in a 24 hour period)  Cough: Dextromethorphan (Delsym)- follow directions on packing with a maximum dose of 120mg  in a 24 hour period.  Nasal Stuffiness: Saline nasal spray and/or Nettie Pot with sterile saline solution  Runny Nose: Fluticasone nasal spray (Flonase) OR Mometasone nasal spray (Nasonex) OR Triamcinolone Acetonide nasal spray (Nasacort)- follow directions on the packaging  Pain/Pressure: Warm washcloth to the face  Sore Throat: Warm salt water gargles  If you have allergies, you may also consider taking an oral antihistamine (like Zyrtec or Claritin) as these may also help with your symptoms.  **Many medications will have more than one ingredient, be sure you are reading the packaging carefully and  not taking more than one dose of the same kind of medication at the same time or too close together. It is OK to use formulas that have all of the ingredients you want, but do not take them in a combined medication and as separate dose too close together. If you have any questions, the pharmacist will be happy to help you decide what is safe.

## 2023-04-14 ENCOUNTER — Encounter: Payer: Self-pay | Admitting: Family Medicine

## 2023-04-14 ENCOUNTER — Ambulatory Visit (INDEPENDENT_AMBULATORY_CARE_PROVIDER_SITE_OTHER): Payer: Medicare Other | Admitting: Family Medicine

## 2023-04-14 VITALS — BP 126/50 | HR 55 | Ht 65.0 in | Wt 148.0 lb

## 2023-04-14 DIAGNOSIS — I10 Essential (primary) hypertension: Secondary | ICD-10-CM | POA: Diagnosis not present

## 2023-04-14 DIAGNOSIS — E875 Hyperkalemia: Secondary | ICD-10-CM

## 2023-04-14 DIAGNOSIS — E782 Mixed hyperlipidemia: Secondary | ICD-10-CM | POA: Diagnosis not present

## 2023-04-14 LAB — COMPREHENSIVE METABOLIC PANEL
ALT: 16 U/L (ref 0–53)
AST: 21 U/L (ref 0–37)
Albumin: 4.3 g/dL (ref 3.5–5.2)
Alkaline Phosphatase: 71 U/L (ref 39–117)
BUN: 9 mg/dL (ref 6–23)
CO2: 31 mEq/L (ref 19–32)
Calcium: 9.5 mg/dL (ref 8.4–10.5)
Chloride: 99 mEq/L (ref 96–112)
Creatinine, Ser: 0.96 mg/dL (ref 0.40–1.50)
GFR: 79.33 mL/min (ref >60.00)
Glucose, Bld: 91 mg/dL (ref 70–99)
Potassium: 5.6 mEq/L — ABNORMAL HIGH (ref 3.5–5.1)
Sodium: 139 mEq/L (ref 135–145)
Total Bilirubin: 0.5 mg/dL (ref 0.2–1.2)
Total Protein: 6.9 g/dL (ref 6.0–8.3)

## 2023-04-14 NOTE — Progress Notes (Signed)
Established Patient Office Visit  Subjective   Patient ID: Cole Bond, male    DOB: 28-Nov-1950  Age: 72 y.o. MRN: [id number]  Chief Complaint  Patient presents with   Medical Management of Chronic Issues    HPI  Patient is here for routine follow-up to discuss hypertension and hyperlipidemia. He reports he is feeling well and has no acute concerns today. He just recently got back from [place], visiting granddaughter and her children.    HYPERTENSION: - Medications: HCTZ 12.5 mg daily, lisinopril 20 mg daily - Compliance: good - Checking BP at home: rarely, usually good  - Denies any SOB, recurrent headaches, CP, vision changes, LE edema, dizziness, palpitations, or medication side effects. - Diet: general - Exercise: very active with work    HYPERLIPIDEMIA - medications: atorvastatin 20 mg daily - compliance: good - medication SEs: none The 10-year ASCVD risk score (Arnett DK, et al., 2019) is: 18.7%   Values used to calculate the score:     Age: 61 years     Sex: Male     Is Non-Hispanic African American: No     Diabetic: No     Tobacco smoker: No     Systolic Blood Pressure: 126 mmHg     Is BP treated: Yes     HDL Cholesterol: 49.4 mg/dL     Total Cholesterol: 141 mg/dL   FORMER SMOKER: - Quit > 15 years, unsure of exact date - smoked 1-2 packs per day for 20+ years     ROS All review of systems negative except what is listed in the HPI    Objective:     BP (!) 126/50   Pulse (!) 55   Ht 5\' 5"  (1.651 m)   Wt 148 lb (67.1 kg)   SpO2 100%   BMI 24.63 kg/m    Physical Exam Vitals reviewed.  Constitutional:      Appearance: Normal appearance.  Neck:     Vascular: No carotid bruit.  Cardiovascular:     Rate and Rhythm: Normal rate and regular rhythm.     Pulses: Normal pulses.     Heart sounds: Normal heart sounds.  Pulmonary:     Effort: Pulmonary effort is normal.     Breath sounds: Normal breath sounds.  Musculoskeletal:     Cervical  back: No tenderness.     Right lower leg: No edema.     Left lower leg: No edema.  Lymphadenopathy:     Cervical: No cervical adenopathy.  Skin:    General: Skin is warm and dry.  Neurological:     Mental Status: He is alert and oriented to person, place, and time.  Psychiatric:        Mood and Affect: Mood normal.        Behavior: Behavior normal.        Thought Content: Thought content normal.        Judgment: Judgment normal.        No results found for any visits on 04/14/23.    The 10-year ASCVD risk score (Arnett DK, et al., 2019) is: 18.7%    Assessment & Plan:   Problem List Items Addressed This Visit     Hyperlipemia (Chronic)    Medication management: continue atorvastatin 20 mg daily  Lifestyle factors for lowering cholesterol include: Diet therapy - heart-healthy diet rich in fruits, veggies, fiber-rich whole grains, lean meats, chicken, fish (at least twice a week), fat-free or 1% dairy products; foods  low in saturated/trans fats, cholesterol, sodium, and sugar. Mediterranean diet has shown to be very heart healthy. Regular exercise - recommend at least 30 minutes a day, 5 times per week Weight management       Hypertension - Primary (Chronic)    Blood pressure is at goal for age and co-morbidities.   Recommendations: continue lisinopril 20 mg daily and HCTZ 12.5 mg daily  - BP goal <130/80 - monitor and log blood pressures at home - check around the same time each day in a relaxed setting - Limit salt to <2000 mg/day - Follow DASH eating plan (heart healthy diet) - limit alcohol to 2 standard drinks per day for men and 1 per day for women - avoid tobacco products - get at least 2 hours of regular aerobic exercise weekly Patient aware of signs/symptoms requiring further/urgent evaluation.      Relevant Orders   Comprehensive metabolic panel    Return in about 6 months (around 10/15/2023) for routine follow-up; schedule AWV.    Clayborne Dana,  NP

## 2023-04-14 NOTE — Assessment & Plan Note (Signed)
Medication management: continue atorvastatin 20 mg daily  Lifestyle factors for lowering cholesterol include: Diet therapy - heart-healthy diet rich in fruits, veggies, fiber-rich whole grains, lean meats, chicken, fish (at least twice a week), fat-free or 1% dairy products; foods low in saturated/trans fats, cholesterol, sodium, and sugar. Mediterranean diet has shown to be very heart healthy. Regular exercise - recommend at least 30 minutes a day, 5 times per week Weight management

## 2023-04-14 NOTE — Patient Instructions (Signed)
Blood pressure is at goal for age and co-morbidities.   Recommendations: continue lisinopril 20 mg daily and HCTZ 12.5 mg daily  - BP goal <130/80 - monitor and log blood pressures at home - check around the same time each day in a relaxed setting - Limit salt to <2000 mg/day - Follow DASH eating plan (heart healthy diet) - limit alcohol to 2 standard drinks per day for men and 1 per day for women - avoid tobacco products - get at least 2 hours of regular aerobic exercise weekly Patient aware of signs/symptoms requiring further/urgent evaluation.   Cholesterol: Medication management: continue atorvastatin 20 mg daily  Lifestyle factors for lowering cholesterol include: Diet therapy - heart-healthy diet rich in fruits, veggies, fiber-rich whole grains, lean meats, chicken, fish (at least twice a week), fat-free or 1% dairy products; foods low in saturated/trans fats, cholesterol, sodium, and sugar. Mediterranean diet has shown to be very heart healthy. Regular exercise - recommend at least 30 minutes a day, 5 times per week Weight management

## 2023-04-14 NOTE — Addendum Note (Signed)
Addended by: Hyman Hopes B on: 04/14/2023 02:35 PM   Modules accepted: Orders

## 2023-04-14 NOTE — Assessment & Plan Note (Signed)
Blood pressure is at goal for age and co-morbidities.   Recommendations: continue lisinopril 20 mg daily and HCTZ 12.5 mg daily  - BP goal <130/80 - monitor and log blood pressures at home - check around the same time each day in a relaxed setting - Limit salt to <2000 mg/day - Follow DASH eating plan (heart healthy diet) - limit alcohol to 2 standard drinks per day for men and 1 per day for women - avoid tobacco products - get at least 2 hours of regular aerobic exercise weekly Patient aware of signs/symptoms requiring further/urgent evaluation.

## 2023-04-18 ENCOUNTER — Other Ambulatory Visit (INDEPENDENT_AMBULATORY_CARE_PROVIDER_SITE_OTHER): Payer: Medicare Other

## 2023-04-18 DIAGNOSIS — E875 Hyperkalemia: Secondary | ICD-10-CM

## 2023-04-18 NOTE — Addendum Note (Signed)
Addended by: Mervin Kung A on: 04/18/2023 02:56 PM   Modules accepted: Orders

## 2023-04-18 NOTE — Addendum Note (Signed)
Addended by: Mervin Kung A on: 04/18/2023 02:53 PM   Modules accepted: Orders

## 2023-04-19 LAB — POTASSIUM: Potassium: 4.8 mmol/L (ref 3.5–5.3)

## 2023-04-21 ENCOUNTER — Other Ambulatory Visit: Payer: Self-pay | Admitting: Family Medicine

## 2023-05-07 ENCOUNTER — Encounter: Payer: Self-pay | Admitting: Family Medicine

## 2023-05-08 MED ORDER — KETOCONAZOLE 2 % EX SHAM: MEDICATED_SHAMPOO | CUTANEOUS | 1 refills | Status: DC

## 2023-05-30 ENCOUNTER — Ambulatory Visit (INDEPENDENT_AMBULATORY_CARE_PROVIDER_SITE_OTHER): Payer: Medicare Other | Admitting: *Deleted

## 2023-05-30 DIAGNOSIS — Z0000 Encounter for general adult medical examination without abnormal findings: Secondary | ICD-10-CM

## 2023-05-30 NOTE — Patient Instructions (Signed)
Cole Bond , Thank you for taking time to come for your Medicare Wellness Visit. I appreciate your ongoing commitment to your health goals. Please review the following plan we discussed and let me know if I can assist you in the future.   These are the goals we discussed:  Goals   None     This is a list of the screening recommended for you and due dates:  Health Maintenance  Topic Date Due   Hepatitis C Screening  Never done   Screening for Lung Cancer  06/29/2020   Zoster (Shingles) Vaccine (1 of 2) 07/15/2023*   Pneumonia Vaccine (2 of 2 - PPSV23 or PCV20) 04/13/2024*   Flu Shot  06/26/2023   Medicare Annual Wellness Visit  05/29/2024   Colon Cancer Screening  11/28/2025   HPV Vaccine  Aged Out   DTaP/Tdap/Td vaccine  Discontinued   COVID-19 Vaccine  Discontinued  *Topic was postponed. The date shown is not the original due date.     Next appointment: Follow up in one year for your annual wellness visit.   Preventive Care 80 Years and Older, Male Preventive care refers to lifestyle choices and visits with your health care provider that can promote health and wellness. What does preventive care include? A yearly physical exam. This is also called an annual well check. Dental exams once or twice a year. Routine eye exams. Ask your health care provider how often you should have your eyes checked. Personal lifestyle choices, including: Daily care of your teeth and gums. Regular physical activity. Eating a healthy diet. Avoiding tobacco and drug use. Limiting alcohol use. Practicing safe sex. Taking low doses of aspirin every day. Taking vitamin and mineral supplements as recommended by your health care provider. What happens during an annual well check? The services and screenings done by your health care provider during your annual well check will depend on your age, overall health, lifestyle risk factors, and family history of disease. Counseling  Your health care  provider may ask you questions about your: Alcohol use. Tobacco use. Drug use. Emotional well-being. Home and relationship well-being. Sexual activity. Eating habits. History of falls. Memory and ability to understand (cognition). Work and work Astronomer. Screening  You may have the following tests or measurements: Height, weight, and BMI. Blood pressure. Lipid and cholesterol levels. These may be checked every 5 years, or more frequently if you are over 57 years old. Skin check. Lung cancer screening. You may have this screening every year starting at age 80 if you have a 30-pack-year history of smoking and currently smoke or have quit within the past 15 years. Fecal occult blood test (FOBT) of the stool. You may have this test every year starting at age 60. Flexible sigmoidoscopy or colonoscopy. You may have a sigmoidoscopy every 5 years or a colonoscopy every 10 years starting at age 54. Prostate cancer screening. Recommendations will vary depending on your family history and other risks. Hepatitis C blood test. Hepatitis B blood test. Sexually transmitted disease (STD) testing. Diabetes screening. This is done by checking your blood sugar (glucose) after you have not eaten for a while (fasting). You may have this done every 1-3 years. Abdominal aortic aneurysm (AAA) screening. You may need this if you are a current or former smoker. Osteoporosis. You may be screened starting at age 30 if you are at high risk. Talk with your health care provider about your test results, treatment options, and if necessary, the need for more  tests. Vaccines  Your health care provider may recommend certain vaccines, such as: Influenza vaccine. This is recommended every year. Tetanus, diphtheria, and acellular pertussis (Tdap, Td) vaccine. You may need a Td booster every 10 years. Zoster vaccine. You may need this after age 70. Pneumococcal 13-valent conjugate (PCV13) vaccine. One dose is  recommended after age 28. Pneumococcal polysaccharide (PPSV23) vaccine. One dose is recommended after age 65. Talk to your health care provider about which screenings and vaccines you need and how often you need them. This information is not intended to replace advice given to you by your health care provider. Make sure you discuss any questions you have with your health care provider. Document Released: 12/08/2015 Document Revised: 07/31/2016 Document Reviewed: 09/12/2015 Elsevier Interactive Patient Education  2017 [employer].  Fall Prevention in the Home Falls can cause injuries. They can happen to people of all ages. There are many things you can do to make your home safe and to help prevent falls. What can I do on the outside of my home? Regularly fix the edges of walkways and driveways and fix any cracks. Remove anything that might make you trip as you walk through a door, such as a raised step or threshold. Trim any bushes or trees on the path to your home. Use bright outdoor lighting. Clear any walking paths of anything that might make someone trip, such as rocks or tools. Regularly check to see if handrails are loose or broken. Make sure that both sides of any steps have handrails. Any raised decks and porches should have guardrails on the edges. Have any leaves, snow, or ice cleared regularly. Use sand or salt on walking paths during winter. Clean up any spills in your garage right away. This includes oil or grease spills. What can I do in the bathroom? Use night lights. Install grab bars by the toilet and in the tub and shower. Do not use towel bars as grab bars. Use non-skid mats or decals in the tub or shower. If you need to sit down in the shower, use a plastic, non-slip stool. Keep the floor dry. Clean up any water that spills on the floor as soon as it happens. Remove soap buildup in the tub or shower regularly. Attach bath mats securely with double-sided non-slip rug  tape. Do not have throw rugs and other things on the floor that can make you trip. What can I do in the bedroom? Use night lights. Make sure that you have a light by your bed that is easy to reach. Do not use any sheets or blankets that are too big for your bed. They should not hang down onto the floor. Have a firm chair that has side arms. You can use this for support while you get dressed. Do not have throw rugs and other things on the floor that can make you trip. What can I do in the kitchen? Clean up any spills right away. Avoid walking on wet floors. Keep items that you use a lot in easy-to-reach places. If you need to reach something above you, use a strong step stool that has a grab bar. Keep electrical cords out of the way. Do not use floor polish or wax that makes floors slippery. If you must use wax, use non-skid floor wax. Do not have throw rugs and other things on the floor that can make you trip. What can I do with my stairs? Do not leave any items on the stairs. Make sure  that there are handrails on both sides of the stairs and use them. Fix handrails that are broken or loose. Make sure that handrails are as long as the stairways. Check any carpeting to make sure that it is firmly attached to the stairs. Fix any carpet that is loose or worn. Avoid having throw rugs at the top or bottom of the stairs. If you do have throw rugs, attach them to the floor with carpet tape. Make sure that you have a light switch at the top of the stairs and the bottom of the stairs. If you do not have them, ask someone to add them for you. What else can I do to help prevent falls? Wear shoes that: Do not have high heels. Have rubber bottoms. Are comfortable and fit you well. Are closed at the toe. Do not wear sandals. If you use a stepladder: Make sure that it is fully opened. Do not climb a closed stepladder. Make sure that both sides of the stepladder are locked into place. Ask someone to  hold it for you, if possible. Clearly mark and make sure that you can see: Any grab bars or handrails. First and last steps. Where the edge of each step is. Use tools that help you move around (mobility aids) if they are needed. These include: Canes. Walkers. Scooters. Crutches. Turn on the lights when you go into a dark area. Replace any light bulbs as soon as they burn out. Set up your furniture so you have a clear path. Avoid moving your furniture around. If any of your floors are uneven, fix them. If there are any pets around you, be aware of where they are. Review your medicines with your doctor. Some medicines can make you feel dizzy. This can increase your chance of falling. Ask your doctor what other things that you can do to help prevent falls. This information is not intended to replace advice given to you by your health care provider. Make sure you discuss any questions you have with your health care provider. Document Released: 09/07/2009 Document Revised: 04/18/2016 Document Reviewed: 12/16/2014 Elsevier Interactive Patient Education  2017 [employer].

## 2023-05-30 NOTE — Progress Notes (Signed)
Subjective:   Cole Bond is a 72 y.o. male who presents for Medicare Annual/Subsequent preventive examination.  Visit Complete: Virtual  I connected with  Cole Bond on 05/30/23 by a audio enabled telemedicine application and verified that I am speaking with the correct person using two identifiers.  Patient Location: Home  Provider Location: Office/Clinic  I discussed the limitations of evaluation and management by telemedicine. The patient expressed understanding and agreed to proceed.  Patient Medicare AWV questionnaire was completed by the patient on 05/28/23; I have confirmed that all information answered by patient is correct and no changes since this date.  Review of Systems     Cardiac Risk Factors include: advanced age (>34men, >54 women);male gender;dyslipidemia;hypertension;smoking/ tobacco exposure     Objective:    Today's Vitals   There is no height or weight on file to calculate BMI.     05/30/2023    3:40 PM 01/27/2023    8:12 AM 03/25/2022    3:42 PM 02/25/2022    2:28 PM 01/24/2021    8:39 AM 06/21/2020    3:53 PM 08/18/2019    1:31 PM  Advanced Directives  Does Patient Have a Medical Advance Directive? Yes Yes Yes Yes Yes Yes Yes  Type of Estate agent of [place];Living will Living will;Healthcare Power of [employer] Power of [place];Living will Living will Healthcare Power of [place];Out of facility DNR (pink MOST or yellow form);Living will Healthcare Power of [place];Living will;Out of facility DNR (pink MOST or yellow form) Living will;Healthcare Power of [employer] of Healthcare Power of Attorney in Chart? No - copy requested  No - copy requested        Current Medications (verified) Outpatient Encounter Medications as of 05/30/2023  Medication Sig   aspirin 325 MG tablet Take 325 mg by mouth daily.    atorvastatin (LIPITOR) 20 MG tablet TAKE 1 TABLET DAILY   hydrochlorothiazide (MICROZIDE) 12.5 MG capsule  TAKE 1 CAPSULE DAILY   ketoconazole (NIZORAL) 2 % shampoo APPLY TOPICALLY TWICE PER WEEK   levETIRAcetam (KEPPRA) 1000 MG tablet Take 1 tablet (1,000 mg total) by mouth 2 (two) times daily.   lisinopril (ZESTRIL) 20 MG tablet TAKE 1 TABLET DAILY   No facility-administered encounter medications on file as of 05/30/2023.    Allergies (verified) Patient has no known allergies.   History: Past Medical History:  Diagnosis Date   Hyperlipemia    Hypertension    Seizures (HCC)    Past Surgical History:  Procedure Laterality Date   APPENDECTOMY     EYE SURGERY     Family History  Problem Relation Age of Onset   Breast cancer Mother    Cancer Mother    Stroke Father    Social History   Socioeconomic History   Marital status: Married    Spouse name: Not on file   Number of children: Not on file   Years of education: Not on file   Highest education level: GED or equivalent  Occupational History   Not on file  Tobacco Use   Smoking status: Former    Packs/day: 1.00    Years: 20.00    Additional pack years: 0.00    Total pack years: 20.00    Types: Cigarettes, E-cigarettes    Quit date: 05/01/2013    Years since quitting: 10.0   Smokeless tobacco: Never  Vaping Use   Vaping Use: Never used  Substance and Sexual Activity   Alcohol use: Yes  Alcohol/week: 3.0 standard drinks of alcohol    Types: 3 Cans of beer per week   Drug use: No   Sexual activity: Not Currently    Partners: Female  Other Topics Concern   Not on file  Social History Narrative   Lives with wife in one story home      Right haned      Highest level of edu- ged       Caffeine: Yes      Social Determinants of Health   Financial Resource Strain: Low Risk  (05/28/2023)   Overall Financial Resource Strain (CARDIA)    Difficulty of Paying Living Expenses: Not hard at all  Food Insecurity: No Food Insecurity (05/28/2023)   Hunger Vital Sign    Worried About Running Out of Food in the Last Year:  Never true    Ran Out of Food in the Last Year: Never true  Transportation Needs: No Transportation Needs (05/28/2023)   PRAPARE - Administrator, Civil Service (Medical): No    Lack of Transportation (Non-Medical): No  Physical Activity: Patient Declined (05/28/2023)   Exercise Vital Sign    Days of Exercise per Week: Patient declined    Minutes of Exercise per Session: Patient declined  Recent Concern: Physical Activity - Inactive (03/07/2023)   Exercise Vital Sign    Days of Exercise per Week: 0 days    Minutes of Exercise per Session: 60 min  Stress: No Stress Concern Present (05/28/2023)   [employer] of Occupational Health - Occupational Stress Questionnaire    Feeling of Stress : Not at all  Social Connections: Moderately Isolated (05/28/2023)   Social Connection and Isolation Panel [NHANES]    Frequency of Communication with Friends and Family: Once a week    Frequency of Social Gatherings with Friends and Family: Once a week    Attends Religious Services: 1 to 4 times per year    Active Member of [employer] or Organizations: No    Attends Engineer, structural: Patient declined    Marital Status: Married    Tobacco Counseling Counseling given: Not Answered   Clinical Intake:  Pre-visit preparation completed: Yes  Pain : No/denies pain  Nutritional Risks: None Diabetes: No  How often do you need to have someone help you when you read instructions, pamphlets, or other written materials from your doctor or pharmacy?: 1 - Never  Interpreter Needed?: No  Information entered by :: [employer], CMA   Activities of Daily Living    05/28/2023    3:15 PM  In your present state of health, do you have any difficulty performing the following activities:  Hearing? 0  Vision? 0  Difficulty concentrating or making decisions? 0  Walking or climbing stairs? 0  Dressing or bathing? 0  Doing errands, shopping? 0  Preparing Food and eating ? N  Using the  Toilet? N  In the past six months, have you accidently leaked urine? N  Do you have problems with loss of bowel control? N  Managing your Medications? N  Managing your Finances? N  Housekeeping or managing your Housekeeping? N    Patient Care Team: Clayborne Dana, NP as PCP - General (Family Medicine) Van Clines, MD as Consulting Physician (Neurology)  Indicate any recent Medical Services you may have received from other than [facility] providers in the past year (date may be approximate).     Assessment:   This is a routine wellness examination for Cole Bond.  Hearing/Vision screen No results found.  Dietary issues and exercise activities discussed:     Goals Addressed   None    Depression Screen    05/30/2023    3:42 PM 04/14/2023    8:13 AM 10/21/2022    8:09 AM 04/16/2022    8:09 AM 03/25/2022    3:42 PM 01/17/2022   10:00 AM  PHQ 2/9 Scores  PHQ - 2 Score 0 0 0 0 0 0  PHQ- 9 Score  0        Fall Risk    05/28/2023    3:15 PM 04/14/2023    8:02 AM 01/27/2023    8:12 AM 10/21/2022    8:08 AM 04/16/2022    8:09 AM  Fall Risk   Falls in the past year? 0 0 0 0 0  Number falls in past yr: 0 0 0 0 0  Injury with Fall? 0 0 0 0 0  Risk for fall due to : No Fall Risks No Fall Risks     Follow up Falls evaluation completed  Falls evaluation completed Falls evaluation completed     MEDICARE RISK AT HOME:   TIMED UP AND GO:  Was the test performed?  No    Cognitive Function:    12/11/2018   10:00 AM 04/29/2018   12:00 PM  MMSE - Mini Mental State Exam  Orientation to time 5 5  Orientation to Place 5 5  Registration 3 3  Attention/ Calculation 5 5  Recall 2 2  Language- name 2 objects 2 2  Language- repeat 1 1  Language- follow 3 step command 3 3  Language- read & follow direction 1 1  Write a sentence 1 1  Copy design 1 1  Total score 29 29        05/30/2023    3:43 PM 03/25/2022    3:45 PM  6CIT Screen  What Year? 0 points 0 points  What month? 0 points 0  points  What time? 0 points 0 points  Count back from 20 0 points 0 points  Months in reverse 0 points 0 points  Repeat phrase 0 points 2 points  Total Score 0 points 2 points    Immunizations Immunization History  Administered Date(s) Administered   Influenza,inj,Quad PF,6+ Mos 10/28/2016   Influenza-Unspecified 10/28/2016   Pneumococcal Conjugate-13 10/28/2016    TDAP status: Due, Education has been provided regarding the importance of this vaccine. Advised may receive this vaccine at local pharmacy or Health Dept. Aware to provide a copy of the vaccination record if obtained from local pharmacy or Health Dept. Verbalized acceptance and understanding.  Flu Vaccine status: Up to date  Pneumococcal vaccine status: Due, Education has been provided regarding the importance of this vaccine. Advised may receive this vaccine at local pharmacy or Health Dept. Aware to provide a copy of the vaccination record if obtained from local pharmacy or Health Dept. Verbalized acceptance and understanding.  Covid-19 vaccine status: Declined, Education has been provided regarding the importance of this vaccine but patient still declined. Advised may receive this vaccine at local pharmacy or Health Dept.or vaccine clinic. Aware to provide a copy of the vaccination record if obtained from local pharmacy or Health Dept. Verbalized acceptance and understanding.  Qualifies for Shingles Vaccine? Yes   Zostavax completed No   Shingrix Completed?: No.    Education has been provided regarding the importance of this vaccine. Patient has been advised to call insurance company to determine  out of pocket expense if they have not yet received this vaccine. Advised may also receive vaccine at local pharmacy or Health Dept. Verbalized acceptance and understanding.  Screening Tests Health Maintenance  Topic Date Due   Hepatitis C Screening  Never done   Lung Cancer Screening  06/29/2020   Medicare Annual Wellness  (AWV)  03/26/2023   Zoster Vaccines- Shingrix (1 of 2) 07/15/2023 (Originally 06/05/2001)   Pneumonia Vaccine 82+ Years old (2 of 2 - PPSV23 or PCV20) 04/13/2024 (Originally 10/28/2017)   INFLUENZA VACCINE  06/26/2023   Colonoscopy  11/28/2025   HPV VACCINES  Aged Out   DTaP/Tdap/Td  Discontinued   COVID-19 Vaccine  Discontinued    Health Maintenance  Health Maintenance Due  Topic Date Due   Hepatitis C Screening  Never done   Lung Cancer Screening  06/29/2020   Medicare Annual Wellness (AWV)  03/26/2023    Colorectal cancer screening: Type of screening: Colonoscopy. Completed 11/29/15. Repeat every 10 years  Lung Cancer Screening: (Low Dose CT Chest recommended if Age 31-80 years, 20 pack-year currently smoking OR have quit w/in 15years.) does qualify.   Lung Cancer Screening Referral: pt declined  Additional Screening:  Hepatitis C Screening: does qualify; Completed N/a  Vision Screening: Recommended annual ophthalmology exams for early detection of glaucoma and other disorders of the eye. Is the patient up to date with their annual eye exam?  Yes  Who is the provider or what is the name of the office in which the patient attends annual eye exams? Dr. Waldo Laine If pt is not established with a provider, would they like to be referred to a provider to establish care? No .   Dental Screening: Recommended annual dental exams for proper oral hygiene  Diabetic Foot Exam: N/a  Community Resource Referral / Chronic Care Management: CRR required this visit?  No   CCM required this visit?  No     Plan:     I have personally reviewed and noted the following in the patient's chart:   Medical and social history Use of alcohol, tobacco or illicit drugs  Current medications and supplements including opioid prescriptions. Patient is not currently taking opioid prescriptions. Functional ability and status Nutritional status Physical activity Advanced directives List of other  physicians Hospitalizations, surgeries, and ER visits in previous 12 months Vitals Screenings to include cognitive, depression, and falls Referrals and appointments  In addition, I have reviewed and discussed with patient certain preventive protocols, quality metrics, and best practice recommendations. A written personalized care plan for preventive services as well as general preventive health recommendations were provided to patient.     Donne Anon, CMA   05/30/2023   After Visit Summary: (MyChart) Due to this being a telephonic visit, the after visit summary with patients personalized plan was offered to patient via MyChart   Nurse Notes: None

## 2023-09-14 ENCOUNTER — Emergency Department (HOSPITAL_BASED_OUTPATIENT_CLINIC_OR_DEPARTMENT_OTHER)
Admission: EM | Admit: 2023-09-14 | Discharge: 2023-09-14 | Disposition: A | Discharge status: Home / Self Care | Payer: Medicare Other | Attending: Emergency Medicine | Admitting: Emergency Medicine

## 2023-09-14 ENCOUNTER — Other Ambulatory Visit: Payer: Self-pay

## 2023-09-14 ENCOUNTER — Emergency Department (HOSPITAL_BASED_OUTPATIENT_CLINIC_OR_DEPARTMENT_OTHER): Payer: Medicare Other

## 2023-09-14 ENCOUNTER — Encounter (HOSPITAL_BASED_OUTPATIENT_CLINIC_OR_DEPARTMENT_OTHER): Payer: Self-pay

## 2023-09-14 DIAGNOSIS — M542 Cervicalgia: Secondary | ICD-10-CM | POA: Diagnosis present

## 2023-09-14 DIAGNOSIS — B029 Zoster without complications: Secondary | ICD-10-CM | POA: Diagnosis not present

## 2023-09-14 DIAGNOSIS — Z7982 Long term (current) use of aspirin: Secondary | ICD-10-CM | POA: Insufficient documentation

## 2023-09-14 LAB — CBC WITH DIFFERENTIAL/PLATELET
Abs Immature Granulocytes: 0.01 10*3/uL (ref 0.00–0.07)
Basophils Absolute: 0 10*3/uL (ref 0.0–0.1)
Basophils Relative: 1 %
Eosinophils Absolute: 0.1 10*3/uL (ref 0.0–0.5)
Eosinophils Relative: 2 %
HCT: 41.8 % (ref 39.0–52.0)
Hemoglobin: 14.3 g/dL (ref 13.0–17.0)
Immature Granulocytes: 0 %
Lymphocytes Relative: 18 %
Lymphs Abs: 0.9 10*3/uL (ref 0.7–4.0)
MCH: 31.3 pg (ref 26.0–34.0)
MCHC: 34.2 g/dL (ref 30.0–36.0)
MCV: 91.5 fL (ref 80.0–100.0)
Monocytes Absolute: 0.6 10*3/uL (ref 0.1–1.0)
Monocytes Relative: 11 %
Neutro Abs: 3.5 10*3/uL (ref 1.7–7.7)
Neutrophils Relative %: 68 %
Platelets: 174 10*3/uL (ref 150–400)
RBC: 4.57 MIL/uL (ref 4.22–5.81)
RDW: 12.3 % (ref 11.5–15.5)
WBC: 5.1 10*3/uL (ref 4.0–10.5)
nRBC: 0 % (ref 0.0–0.2)

## 2023-09-14 LAB — BASIC METABOLIC PANEL
Anion gap: 9 (ref 5–15)
BUN: 11 mg/dL (ref 8–23)
CO2: 29 mmol/L (ref 22–32)
Calcium: 9.2 mg/dL (ref 8.9–10.3)
Chloride: 99 mmol/L (ref 98–111)
Creatinine, Ser: 0.92 mg/dL (ref 0.61–1.24)
GFR, Estimated: >60 mL/min (ref >60)
Glucose, Bld: 99 mg/dL (ref 70–99)
Potassium: 4.6 mmol/L (ref 3.5–5.1)
Sodium: 137 mmol/L (ref 135–145)

## 2023-09-14 MED ORDER — VALACYCLOVIR HCL 500 MG PO TABS
1000.0000 mg | ORAL_TABLET | Freq: Once | ORAL | Status: AC
  Administered 2023-09-14: 1000 mg via ORAL
  Filled 2023-09-14: qty 2

## 2023-09-14 MED ORDER — PREDNISONE 20 MG PO TABS
40.0000 mg | ORAL_TABLET | Freq: Once | ORAL | Status: AC
  Administered 2023-09-14: 40 mg via ORAL
  Filled 2023-09-14: qty 2

## 2023-09-14 MED ORDER — PREDNISONE 10 MG PO TABS
40.0000 mg | ORAL_TABLET | Freq: Every day | ORAL | 0 refills | Status: AC
Start: 2023-09-15 — End: 2023-09-21

## 2023-09-14 MED ORDER — IOHEXOL 350 MG/ML SOLN
75.0000 mL | Freq: Once | INTRAVENOUS | Status: AC | PRN
  Administered 2023-09-14: 75 mL via INTRAVENOUS

## 2023-09-14 MED ORDER — VALACYCLOVIR HCL 1 G PO TABS: 1000.0000 mg | ORAL_TABLET | Freq: Three times a day (TID) | ORAL | 0 refills | Status: AC

## 2023-09-14 NOTE — ED Provider Notes (Signed)
[place] [facility] AT [facility] Provider Note   CSN: [id number] Arrival date & time: 09/14/23  0957     History  Chief Complaint  Patient presents with   Neck Pain    Cole Bond is a 72 y.o. male presented to the ED with gradual onset of right sided posterior neck pain for 3 to 4 days.  He denies any preceding trauma or history of spinal injury or cervical surgery or radiculopathy.  He says he is having pain rating down the right side of his neck that began as "ear fullness" and now is down to his right shoulder.  It is worse with lateral head movement.  He reports he has a mild intermittent headache that is posterior and right sided.  Denies blurred vision or loss of vision.  He noted a red bumpy rash that developed along the right aspect of his neck and his right shoulder, with some mild blistering, that is not itchy or burning, over the past 24 hours.  No sick contacts in the house with similar rash.  HPI     Home Medications Prior to Admission medications   Medication Sig Start Date End Date Taking? Authorizing Provider  predniSONE (DELTASONE) 10 MG tablet Take 4 tablets (40 mg total) by mouth daily with breakfast for 6 days. 09/15/23 09/21/23 Yes Suzanne Garbers, Kermit Balo, MD  valACYclovir (VALTREX) 1000 MG tablet Take 1 tablet (1,000 mg total) by mouth 3 (three) times daily for 7 days. 09/14/23 09/21/23 Yes Toddrick Sanna, Kermit Balo, MD  aspirin 325 MG tablet Take 325 mg by mouth daily.     [provider]  atorvastatin (LIPITOR) 20 MG tablet TAKE 1 TABLET DAILY 04/21/23   Worthy Rancher B, FNP  hydrochlorothiazide (MICROZIDE) 12.5 MG capsule TAKE 1 CAPSULE DAILY 11/19/22   Bradd Canary, MD  ketoconazole (NIZORAL) 2 % shampoo APPLY TOPICALLY TWICE PER WEEK 05/08/23   Clayborne Dana, NP  levETIRAcetam (KEPPRA) 1000 MG tablet Take 1 tablet (1,000 mg total) by mouth 2 (two) times daily. 01/27/23   Van Clines, MD  lisinopril (ZESTRIL) 20 MG tablet TAKE  1 TABLET DAILY 11/19/22   Bradd Canary, MD      Allergies    Patient has no known allergies.    Review of Systems   Review of Systems  Physical Exam Updated Vital Signs BP (!) 149/63   Pulse (!) 56   Temp 98.6 F (37 C) (Oral)   Resp 17   Ht 5\' 5"  (1.651 m)   Wt 67 kg   SpO2 96%   BMI 24.58 kg/m  Physical Exam Constitutional:      General: He is not in acute distress. HENT:     Head: Normocephalic and atraumatic.     Right Ear: Tympanic membrane and ear canal normal.     Left Ear: Tympanic membrane and ear canal normal.  Eyes:     Conjunctiva/sclera: Conjunctivae normal.     Pupils: Pupils are equal, round, and reactive to light.  Cardiovascular:     Rate and Rhythm: Normal rate and regular rhythm.  Pulmonary:     Effort: Pulmonary effort is normal. No respiratory distress.  Abdominal:     General: There is no distension.     Tenderness: There is no abdominal tenderness.  Skin:    General: Skin is warm and dry.     Comments: Erythematous satellite type rash distributed in a C3 distribution down the right side of the  neck over the shoulder and into the anterior chest wall  Neurological:     General: No focal deficit present.     Mental Status: He is alert. Mental status is at baseline.  Psychiatric:        Mood and Affect: Mood normal.        Behavior: Behavior normal.     ED Results / Procedures / Treatments   Labs (all labs ordered are listed, but only abnormal results are displayed) Labs Reviewed  BASIC METABOLIC PANEL  CBC WITH DIFFERENTIAL/PLATELET    EKG None  Radiology CT ANGIO HEAD NECK W WO CM  Result Date: 09/14/2023 CLINICAL DATA:  Right sided posterior neck pain and headache. Vertebral artery dissection suspected EXAM: CT ANGIOGRAPHY HEAD AND NECK WITH AND WITHOUT CONTRAST TECHNIQUE: Multidetector CT imaging of the head and neck was performed using the standard protocol during bolus administration of intravenous contrast. Multiplanar CT  image reconstructions and MIPs were obtained to evaluate the vascular anatomy. Carotid stenosis measurements (when applicable) are obtained utilizing NASCET criteria, using the distal internal carotid diameter as the denominator. RADIATION DOSE REDUCTION: This exam was performed according to the departmental dose-optimization program which includes automated exposure control, adjustment of the mA and/or kV according to patient size and/or use of iterative reconstruction technique. CONTRAST:  75mL OMNIPAQUE IOHEXOL 350 MG/ML SOLN COMPARISON:  08/09/2019 brain MRI. FINDINGS: CT HEAD FINDINGS Brain: Chronic bilateral cerebellar infarcts of similar degree to prior. Ischemic gliosis in the cerebral white matter and pons is also redemonstrated. No evidence of acute infarct, hemorrhage, hydrocephalus, mass, or collection. Vascular: No hyperdense vessel or unexpected calcification. Skull: Normal. Negative for fracture or focal lesion. Sinuses/Orbits: No acute finding. Review of the MIP images confirms the above findings CTA NECK FINDINGS Aortic arch: Extensive atheromatous plaque.  Three vessel branching. Right carotid system: Diffuse atheromatous wall thickening and. No flow reducing stenosis or ulceration Left carotid system: Diffuse atheromatous wall thickening. No flow reducing stenosis or ulceration Vertebral arteries: Dominant left vertebral artery. The vertebral and subclavian arteries are widely patent. No indication of vertebral dissection. Skeleton: No acute finding Other neck: No acute finding Upper chest: Airway thickening and reticulation in the apical lungs with biapical subpleural scarring. Review of the MIP images confirms the above findings CTA HEAD FINDINGS Anterior circulation: Extensive atheromatous calcification of the cavernous carotids. No major branch occlusion, beading, aneurysm, or proximal flow reducing stenosis. Posterior circulation: Dominant left vertebral artery which shows calcified plaque at  the V4 segment. No branch occlusion, beading, or aneurysm. Atheromatous plaque more notably affects the left PCA without significant stenosis Venous sinuses: Unremarkable for arterial timing Anatomic variants: None significant Review of the MIP images confirms the above findings IMPRESSION: Head CT: 1. No acute finding. 2. 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 CTA: 1. No emergent finding.  No evidence of dissection. 2. Atherosclerosis without flow reducing stenosis of major arteries. Electronically Signed   By: Tiburcio Pea M.D.   On: 09/14/2023 12:47    Procedures Procedures    Medications Ordered in ED Medications  iohexol (OMNIPAQUE) 350 MG/ML injection 75 mL (75 mLs Intravenous Contrast Given 09/14/23 1153)  valACYclovir (VALTREX) tablet 1,000 mg (1,000 mg Oral Given 09/14/23 1301)  predniSONE (DELTASONE) tablet 40 mg (40 mg Oral Given 09/14/23 1301)    ED Course/ Medical Decision Making/ A&P  Medical Decision Making Amount and/or Complexity of Data Reviewed Labs: ordered. Radiology: ordered.  Risk Prescription drug management.   Patient is here with right posterior neck pain and also noted to have an erythematous rash that is developed.  This rash could be consistent with shingles given his dermatomal distribution.    However, given the deeper tracking posterior right-sided neck pain as well as posterior headache, I do think CT imaging is reasonable to rule out vascular injury or dissection.  Cervical impingement or radiculopathy at C3 would also be a possibility-but does not explain the rash.  CT imaging with no emergent findings.  Will start the patient on prednisone and valacyclovir for suspected shingles and have him follow-up with PCP.        Final Clinical Impression(s) / ED Diagnoses Final diagnoses:  Herpes zoster without complication  Neck pain on right side    Rx / DC Orders ED Discharge Orders           Ordered    predniSONE (DELTASONE) 10 MG tablet  Daily with breakfast        09/14/23 1305    valACYclovir (VALTREX) 1000 MG tablet  3 times daily        09/14/23 1305              Terald Sleeper, MD 09/14/23 1435

## 2023-09-14 NOTE — ED Triage Notes (Signed)
The patient has had neck pain that moves into his shoulder for two days. Then today woke up and there are red spots on his neck. No fever, no obvious injury.

## 2023-10-07 ENCOUNTER — Ambulatory Visit (INDEPENDENT_AMBULATORY_CARE_PROVIDER_SITE_OTHER): Payer: Medicare Other | Admitting: Family Medicine

## 2023-10-07 ENCOUNTER — Encounter: Payer: Self-pay | Admitting: Family Medicine

## 2023-10-07 VITALS — BP 134/48 | HR 64 | Ht 65.0 in | Wt 146.0 lb

## 2023-10-07 DIAGNOSIS — E782 Mixed hyperlipidemia: Secondary | ICD-10-CM | POA: Diagnosis not present

## 2023-10-07 DIAGNOSIS — I10 Essential (primary) hypertension: Secondary | ICD-10-CM

## 2023-10-07 DIAGNOSIS — B0229 Other postherpetic nervous system involvement: Secondary | ICD-10-CM | POA: Diagnosis not present

## 2023-10-07 LAB — TSH: TSH: 5.37 u[IU]/mL (ref 0.35–5.50)

## 2023-10-07 LAB — COMPREHENSIVE METABOLIC PANEL
ALT: 16 U/L (ref 0–53)
AST: 21 U/L (ref 0–37)
Albumin: 4.6 g/dL (ref 3.5–5.2)
Alkaline Phosphatase: 70 U/L (ref 39–117)
BUN: 12 mg/dL (ref 6–23)
CO2: 32 meq/L (ref 19–32)
Calcium: 9.5 mg/dL (ref 8.4–10.5)
Chloride: 99 meq/L (ref 96–112)
Creatinine, Ser: 0.87 mg/dL (ref 0.40–1.50)
GFR: 86.31 mL/min (ref >60.00)
Glucose, Bld: 93 mg/dL (ref 70–99)
Potassium: 4.4 meq/L (ref 3.5–5.1)
Sodium: 139 meq/L (ref 135–145)
Total Bilirubin: 0.6 mg/dL (ref 0.2–1.2)
Total Protein: 7.1 g/dL (ref 6.0–8.3)

## 2023-10-07 LAB — LIPID PANEL
Cholesterol: 147 mg/dL (ref 0–200)
HDL: 53.8 mg/dL (ref >39.00)
LDL Cholesterol: 78 mg/dL (ref 0–99)
NonHDL: 93.37
Total CHOL/HDL Ratio: 3
Triglycerides: 79 mg/dL (ref 0.0–149.0)
VLDL: 15.8 mg/dL (ref 0.0–40.0)

## 2023-10-07 MED ORDER — GABAPENTIN 300 MG PO CAPS: 300.0000 mg | ORAL_CAPSULE | Freq: Two times a day (BID) | ORAL | 3 refills | Status: DC

## 2023-10-07 NOTE — Assessment & Plan Note (Signed)
Medication management: continue atorvastatin 20 mg daily  Lifestyle factors for lowering cholesterol include: Diet therapy - heart-healthy diet rich in fruits, veggies, fiber-rich whole grains, lean meats, chicken, fish (at least twice a week), fat-free or 1% dairy products; foods low in saturated/trans fats, cholesterol, sodium, and sugar. Mediterranean diet has shown to be very heart healthy. Regular exercise - recommend at least 30 minutes a day, 5 times per week Weight management  Labs today

## 2023-10-07 NOTE — Progress Notes (Signed)
Established Patient Office Visit  Subjective   Patient ID: Cole Bond, male    DOB: 12/21/1950  Age: 72 y.o. MRN: [id number]  Chief Complaint  Patient presents with   Medical Management of Chronic Issues    HPI  Patient is here for routine follow-up.    Discussed the use of AI scribe software for clinical note transcription with the patient, who gave verbal consent to proceed.  History of Present Illness   The patient, with a history of hypertension and hyperlipidemia, presents for a regular follow-up but reports new onset right-sided scalp, neck, and shoulder pain. The pain, which started around the same time as a recent shingles outbreak (ED visit one month ago), is described as sensitive to touch and sometimes burning. The patient also reports difficulty sleeping on the affected side due to the discomfort. A recent CT scan of the head and neck was normal.  The patient's hypertension is managed with hydrochlorothiazide 12.5mg  and lisinopril 20mg , and hyperlipidemia with Lipitor 20mg . The patient reports no chest pain, shortness of breath, or swelling in the feet. He remains active and continues to work.         HYPERTENSION: - Medications: HCTZ 12.5 mg daily, lisinopril 20 mg daily - Compliance: good - Checking BP at home: rarely, usually good  - Denies any SOB, recurrent headaches, CP, vision changes, LE edema, dizziness, palpitations, or medication side effects. - Diet: general, heart healthy - Exercise: very active with work    HYPERLIPIDEMIA - medications: atorvastatin 20 mg daily - compliance: good - medication SEs: none The 10-year ASCVD risk score (Arnett DK, et al., 2019) is: 22.2%   Values used to calculate the score:     Age: 46 years     Sex: Male     Is Non-Hispanic African American: No     Diabetic: No     Tobacco smoker: No     Systolic Blood Pressure: 134 mmHg     Is BP treated: Yes     HDL Cholesterol: 49.4 mg/dL     Total Cholesterol: 141  mg/dL       ROS All review of systems negative except what is listed in the HPI    Objective:     BP (!) 134/48   Pulse 64   Ht 5\' 5"  (1.651 m)   Wt 146 lb (66.2 kg)   SpO2 99%   BMI 24.30 kg/m    Physical Exam Vitals reviewed.  Constitutional:      Appearance: Normal appearance.  Neck:     Vascular: No carotid bruit.  Cardiovascular:     Rate and Rhythm: Normal rate and regular rhythm.     Pulses: Normal pulses.     Heart sounds: Normal heart sounds.  Pulmonary:     Effort: Pulmonary effort is normal.     Breath sounds: Normal breath sounds.  Musculoskeletal:     Cervical back: No tenderness.     Right lower leg: No edema.     Left lower leg: No edema.  Lymphadenopathy:     Cervical: No cervical adenopathy.  Skin:    General: Skin is warm and dry.     Comments: Fading rash to right C3 region, no open lesions; no scalp lesions  Neurological:     Mental Status: He is alert and oriented to person, place, and time.  Psychiatric:        Mood and Affect: Mood normal.        Behavior:  Behavior normal.        Thought Content: Thought content normal.        Judgment: Judgment normal.      No results found for any visits on 10/07/23.    The 10-year ASCVD risk score (Arnett DK, et al., 2019) is: 22.2%    Assessment & Plan:   Problem List Items Addressed This Visit       Active Problems   Hyperlipemia - Primary (Chronic)    Medication management: continue atorvastatin 20 mg daily  Lifestyle factors for lowering cholesterol include: Diet therapy - heart-healthy diet rich in fruits, veggies, fiber-rich whole grains, lean meats, chicken, fish (at least twice a week), fat-free or 1% dairy products; foods low in saturated/trans fats, cholesterol, sodium, and sugar. Mediterranean diet has shown to be very heart healthy. Regular exercise - recommend at least 30 minutes a day, 5 times per week Weight management  Labs today       Relevant Orders    Comprehensive metabolic panel   Lipid panel   Hypertension (Chronic)    Blood pressure is at goal for age and co-morbidities.   Recommendations: continue lisinopril 20 mg daily and HCTZ 12.5 mg daily  - BP goal <130/80 - monitor and log blood pressures at home - check around the same time each day in a relaxed setting - Limit salt to <2000 mg/day - Follow DASH eating plan (heart healthy diet) - limit alcohol to 2 standard drinks per day for men and 1 per day for women - avoid tobacco products - get at least 2 hours of regular aerobic exercise weekly Patient aware of signs/symptoms requiring further/urgent evaluation. Labs today       Relevant Orders   Comprehensive metabolic panel   Lipid panel   TSH   Other Visit Diagnoses     Postherpetic neuralgia     Pain on the right side of the scalp, neck, and shoulder following a recent shingles outbreak. CT scan of the head and neck was normal at related ED visit. Discussed the nature of postherpetic neuralgia and its potential duration. -Start Gabapentin at 300 mg daily, increasing to twice daily as tolerated.   Relevant Medications   gabapentin (NEURONTIN) 300 MG capsule        Return in about 6 months (around 04/05/2024) for routine follow-up (HTN, HLD).    Clayborne Dana, NP

## 2023-10-07 NOTE — Assessment & Plan Note (Signed)
Blood pressure is at goal for age and co-morbidities.   Recommendations: continue lisinopril 20 mg daily and HCTZ 12.5 mg daily  - BP goal <130/80 - monitor and log blood pressures at home - check around the same time each day in a relaxed setting - Limit salt to <2000 mg/day - Follow DASH eating plan (heart healthy diet) - limit alcohol to 2 standard drinks per day for men and 1 per day for women - avoid tobacco products - get at least 2 hours of regular aerobic exercise weekly Patient aware of signs/symptoms requiring further/urgent evaluation. Labs today

## 2023-10-15 ENCOUNTER — Ambulatory Visit: Payer: Medicare Other | Admitting: Family Medicine

## 2023-12-29 ENCOUNTER — Other Ambulatory Visit: Payer: Self-pay | Admitting: Family Medicine

## 2023-12-31 ENCOUNTER — Other Ambulatory Visit: Payer: Self-pay | Admitting: Family Medicine

## 2024-01-20 ENCOUNTER — Ambulatory Visit (INDEPENDENT_AMBULATORY_CARE_PROVIDER_SITE_OTHER): Payer: Medicare Other | Admitting: Neurology

## 2024-01-20 ENCOUNTER — Encounter: Payer: Self-pay | Admitting: Neurology

## 2024-01-20 DIAGNOSIS — G40009 Localization-related (focal) (partial) idiopathic epilepsy and epileptic syndromes with seizures of localized onset, not intractable, without status epilepticus: Secondary | ICD-10-CM | POA: Diagnosis not present

## 2024-01-20 MED ORDER — LEVETIRACETAM 1000 MG PO TABS: 1000.0000 mg | ORAL_TABLET | Freq: Two times a day (BID) | ORAL | 3 refills | Status: AC
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
## 2024-02-05 ENCOUNTER — Ambulatory Visit (INDEPENDENT_AMBULATORY_CARE_PROVIDER_SITE_OTHER): Admitting: Family Medicine

## 2024-02-05 ENCOUNTER — Other Ambulatory Visit: Payer: Self-pay | Admitting: Family Medicine

## 2024-02-05 ENCOUNTER — Encounter: Payer: Self-pay | Admitting: Family Medicine

## 2024-02-05 VITALS — BP 128/43 | HR 64 | Ht 65.0 in | Wt 139.0 lb

## 2024-02-05 DIAGNOSIS — R1319 Other dysphagia: Secondary | ICD-10-CM

## 2024-02-05 DIAGNOSIS — I10 Essential (primary) hypertension: Secondary | ICD-10-CM | POA: Diagnosis not present

## 2024-02-05 DIAGNOSIS — E782 Mixed hyperlipidemia: Secondary | ICD-10-CM | POA: Diagnosis not present

## 2024-02-05 NOTE — Progress Notes (Signed)
 Established Patient Office Visit  Subjective   Patient ID: Cole Bond, male    DOB: July 07, 1951  Age: 73 y.o. MRN: [id number]  Chief Complaint  Patient presents with   Medical Management of Chronic Issues    HPI  Patient is here for routine follow-up.    Discussed the use of AI scribe software for clinical note transcription with the patient, who gave verbal consent to proceed.  History of Present Illness The patient presents with difficulty swallowing and persistent hiccups.  He experiences difficulty swallowing, particularly with larger food items and capsules like Keppra. Food often feels stuck in the middle of his throat, requiring him to take very small bites. Certain foods like mac and cheese and chips are easier to swallow, but larger items are problematic. He sometimes has to lean over and try to swallow repeatedly without success, and occasionally, food falls out when he leans over a toilet.  He has been unable to swallow his Keppra, a large capsule, which is concerning as it has been effective in controlling his seizures. An incident occurred yesterday morning where he took Keppra, hydrochlorothiazide, and lisinopril, and all came back up simultaneously. He has not taken any medication this morning.  He experiences persistent hiccups, which he believes may contribute to a sensation of reflux, as it feels like everything is pushing back up. This sensation is particularly problematic with larger food items and capsules. Sometimes, when he hiccups hard, it feels like he cannot catch his breath. He can alleviate this by stretching his arms above his head and walking a little bit. This sensation is not necessarily related to eating. No chest pain, headache, or swelling. He is unsure about his blood pressure at home but notes it was a little high this morning. Hiccups have been previously investigated with no found cause.       HYPERTENSION: - Medications: HCTZ 12.5 mg  daily, lisinopril 20 mg daily - Compliance: good - Checking BP at home: rarely, usually good  - Denies any SOB, recurrent headaches, CP, vision changes, LE edema, dizziness, palpitations, or medication side effects. - Diet: general, heart healthy - Exercise: very active with work    HYPERLIPIDEMIA - medications: atorvastatin 20 mg daily - compliance: good - medication SEs: none The 10-year ASCVD risk score (Arnett DK, et al., 2019) is: 20.4%   Values used to calculate the score:     Age: 75 years     Sex: Male     Is Non-Hispanic African American: No     Diabetic: No     Tobacco smoker: No     Systolic Blood Pressure: 128 mmHg     Is BP treated: Yes     HDL Cholesterol: 53.8 mg/dL     Total Cholesterol: 147 mg/dL       ROS All review of systems negative except what is listed in the HPI    Objective:     BP (!) 128/43   Pulse 64   Ht 5\' 5"  (1.651 m)   Wt 139 lb (63 kg)   SpO2 97%   BMI 23.13 kg/m    Physical Exam Vitals reviewed.  Constitutional:      Appearance: Normal appearance.  Neck:     Vascular: No carotid bruit.  Cardiovascular:     Rate and Rhythm: Normal rate and regular rhythm.     Pulses: Normal pulses.     Heart sounds: Normal heart sounds.  Pulmonary:     Effort: Pulmonary  effort is normal.     Breath sounds: Normal breath sounds.  Musculoskeletal:     Cervical back: No tenderness.     Right lower leg: No edema.     Left lower leg: No edema.  Lymphadenopathy:     Cervical: No cervical adenopathy.  Skin:    General: Skin is warm and dry.     Comments: Fading rash to right C3 region, no open lesions; no scalp lesions  Neurological:     Mental Status: He is alert and oriented to person, place, and time.  Psychiatric:        Mood and Affect: Mood normal.        Behavior: Behavior normal.        Thought Content: Thought content normal.        Judgment: Judgment normal.      No results found for any visits on 02/05/24.    The  10-year ASCVD risk score (Arnett DK, et al., 2019) is: 20.4%    Assessment & Plan:   Problem List Items Addressed This Visit       Active Problems   Hyperlipemia (Chronic)   Medication management: continue atorvastatin 20 mg daily  Lifestyle factors for lowering cholesterol include: Diet therapy - heart-healthy diet rich in fruits, veggies, fiber-rich whole grains, lean meats, chicken, fish (at least twice a week), fat-free or 1% dairy products; foods low in saturated/trans fats, cholesterol, sodium, and sugar. Mediterranean diet has shown to be very heart healthy. Regular exercise - recommend at least 30 minutes a day, 5 times per week Weight management  Labs stable 4 months ago, deferred recheck       Hypertension (Chronic)   Blood pressure is at goal for age and co-morbidities.   Recommendations: continue lisinopril 20 mg daily and HCTZ 12.5 mg daily  - BP goal <130/80 - monitor and log blood pressures at home - check around the same time each day in a relaxed setting - Limit salt to <2000 mg/day - Follow DASH eating plan (heart healthy diet) - limit alcohol to 2 standard drinks per day for men and 1 per day for women - avoid tobacco products - get at least 2 hours of regular aerobic exercise weekly Patient aware of signs/symptoms requiring further/urgent evaluation.       Other Visit Diagnoses       Esophageal dysphagia    -  Primary Difficulty swallowing capsules and certain foods, possibly due to esophageal narrowing or reflux. Referral to gastroenterologist needed for further evaluation. - Refer to gastroenterologist for evaluation. - Advise him to contact neurologist regarding Keppra formulation issues. - Consider alternative formulations of Keppra if swallowing difficulties persist.    Relevant Orders   Ambulatory referral to Gastroenterology         Return in about 6 months (around 08/07/2024) for routine follow-up.    Clayborne Dana, NP

## 2024-02-05 NOTE — Assessment & Plan Note (Signed)
 Medication management: continue atorvastatin 20 mg daily  Lifestyle factors for lowering cholesterol include: Diet therapy - heart-healthy diet rich in fruits, veggies, fiber-rich whole grains, lean meats, chicken, fish (at least twice a week), fat-free or 1% dairy products; foods low in saturated/trans fats, cholesterol, sodium, and sugar. Mediterranean diet has shown to be very heart healthy. Regular exercise - recommend at least 30 minutes a day, 5 times per week Weight management  Labs stable 4 months ago, deferred recheck

## 2024-02-05 NOTE — Assessment & Plan Note (Signed)
Blood pressure is at goal for age and co-morbidities.   Recommendations: continue lisinopril 20 mg daily and HCTZ 12.5 mg daily  - BP goal <130/80 - monitor and log blood pressures at home - check around the same time each day in a relaxed setting - Limit salt to <2000 mg/day - Follow DASH eating plan (heart healthy diet) - limit alcohol to 2 standard drinks per day for men and 1 per day for women - avoid tobacco products - get at least 2 hours of regular aerobic exercise weekly Patient aware of signs/symptoms requiring further/urgent evaluation.

## 2024-02-06 ENCOUNTER — Encounter: Payer: Self-pay | Admitting: Physician Assistant

## 2024-03-28 NOTE — Progress Notes (Unsigned)
 Brigitte Canard, PA-C [address] [place], [place]  [address] Phone: [phone]   Gastroenterology Consultation  Referring Provider:     Everlina Hock, NP Primary Care Physician:  Everlina Hock, NP Primary Gastroenterologist:  Brigitte Canard, PA-C / Legrand Puma, MD  Reason for Consultation:     Dysphagia, GERD, hiccups        HPI:   Cole Bond is a 73 y.o. y/o male referred for consultation & management  by Everlina Hock, NP.  Patient is referred to evaluate dysphagia.  He feels like large food and capsules like Keppra  are getting stuck in the middle of his throat.  He has to take small bites.  Can eat smaller softer foods.  Sometimes has to lean over and try to swallow repeatedly without success.  Occasionally food comes back up.  He is not able to swallow his Keppra  which is a large capsule.  He needs this medication in order to control his seizures.  His last seizure was over 5 years ago.  He has also had recurrent chronic hiccups for 5 years and acid reflux symptoms.  Sometimes cannot catch his breath when he hiccups hard.  He drinks 2 or 3 beers daily.  No sodas or coffee.  He is not currently on any treatment for acid reflux.  Denies abdominal pain.  Has had mild weight loss.  Denies diarrhea, constipation, melena, or hematochezia.  Patient reports seeing gastroenterologist in [facility] 5 years ago.  He reports having an EGD and colonoscopy.  He was told he had gastric polyps.  We are requesting GI records.  11/2015 Colonoscopy: Results unavailable  PMH: Epilepsy, HTN  Past Medical History:  Diagnosis Date   Hyperlipemia    Hypertension    Pneumonia    Seizures (HCC)    Stroke [facility])     Past Surgical History:  Procedure Laterality Date   APPENDECTOMY     CATARACT EXTRACTION, BILATERAL Bilateral     Prior to Admission medications   Medication Sig Start Date End Date Taking? Authorizing Provider  aspirin 325 MG tablet Take 325 mg by mouth daily.      [provider]  atorvastatin  (LIPITOR) 20 MG tablet TAKE 1 TABLET DAILY 04/21/23   Webb, Padonda B, FNP  hydrochlorothiazide  (MICROZIDE ) 12.5 MG capsule TAKE 1 CAPSULE DAILY 12/29/23   Minna Amass B, NP  ketoconazole  (NIZORAL ) 2 % shampoo APPLY TOPICALLY TWICE A WEEK 12/31/23   Beck, Taylor B, NP  levETIRAcetam  (KEPPRA ) 1000 MG tablet Take 1 tablet (1,000 mg total) by mouth 2 (two) times daily. 01/20/24   Jhonny Moss, MD  lisinopril  (ZESTRIL ) 20 MG tablet TAKE 1 TABLET DAILY 02/05/24   Everlina Hock, NP    Family History  Problem Relation Age of Onset   Breast cancer Mother    Stroke Father    Hypertension Father    Hypercholesterolemia Father    Diabetes Father    Hypertension Brother      Social History   Tobacco Use   Smoking status: Former    Current packs/day: 0.00    Average packs/day: 1 pack/day for 20.0 years (20.0 ttl pk-yrs)    Types: Cigarettes, E-cigarettes    Start date: 05/01/1993    Quit date: 05/01/2013    Years since quitting: 10.9   Smokeless tobacco: Never  Vaping Use   Vaping status: Never Used  Substance Use Topics   Alcohol use: Yes  Alcohol/week: 3.0 standard drinks of alcohol    Types: 3 Cans of beer per week    Comment: 2 per day   Drug use: No    Allergies as of 03/29/2024   (No Known Allergies)    Review of Systems:    All systems reviewed and negative except where noted in HPI.   Physical Exam:  BP (!) 120/50 (BP Location: Left Arm, Patient Position: Sitting, Cuff Size: Normal)   Pulse 64   Ht 5' 4.5" (1.638 m) Comment: height measured without shoes  Wt 139 lb (63 kg)   BMI 23.49 kg/m  No LMP for male patient.  General:   Alert,  Well-developed, well-nourished, pleasant and cooperative in NAD Lungs:  Respirations even and unlabored.  Clear throughout to auscultation.   No wheezes, crackles, or rhonchi. No acute distress. Mouth: He has upper and lower dentures.  No oral lesions. Heart:  Regular rate and rhythm; no murmurs,  clicks, rubs, or gallops. Abdomen:  Normal bowel sounds.  No bruits.  Soft, and non-distended without masses, hepatosplenomegaly or hernias noted.  No Tenderness.  No guarding or rebound tenderness.    Neurologic:  Alert and oriented x3;  grossly normal neurologically. Psych:  Alert and cooperative. Normal mood and affect.  Imaging Studies: No results found.  Labs: CBC    Component Value Date/Time   WBC 5.1 09/14/2023 1114   RBC 4.57 09/14/2023 1114   HGB 14.3 09/14/2023 1114   HCT 41.8 09/14/2023 1114   PLT 174 09/14/2023 1114   MCV 91.5 09/14/2023 1114   MCH 31.3 09/14/2023 1114   MCHC 34.2 09/14/2023 1114   RDW 12.3 09/14/2023 1114   LYMPHSABS 0.9 09/14/2023 1114   MONOABS 0.6 09/14/2023 1114   EOSABS 0.1 09/14/2023 1114   BASOSABS 0.0 09/14/2023 1114    CMP     Component Value Date/Time   NA 139 10/07/2023 0842   K 4.4 10/07/2023 0842   CL 99 10/07/2023 0842   CO2 32 10/07/2023 0842   GLUCOSE 93 10/07/2023 0842   BUN 12 10/07/2023 0842   CREATININE 0.87 10/07/2023 0842   CALCIUM  9.5 10/07/2023 0842   PROT 7.1 10/07/2023 0842   ALBUMIN 4.6 10/07/2023 0842   AST 21 10/07/2023 0842   ALT 16 10/07/2023 0842   ALKPHOS 70 10/07/2023 0842   BILITOT 0.6 10/07/2023 0842   GFRNONAA >60 09/14/2023 1114   GFRAA >60 06/30/2019 1434    Assessment and Plan:   Cole Bond is a 73 y.o. y/o male has been referred for:   1.  Dysphagia to solid foods and pills - Barium swallow with tablet - EGD with possible dilation I discussed risks of EGD with patient to include risk of bleeding, perforation, and risk of sedation.  Patient expressed understanding and agrees to proceed with EGD.  -He will talk with neurologist about getting liquid Keppra  or smaller capsules.  2.  Hiccups; intractable -EGD - I encouraged him to stop drinking beer and carbonated drinks.  3.  GERD -Start Rx pantoprazole 40 Mg 1 tablet once daily. -Recommend Lifestyle Modifications to prevent Acid  Reflux.  Rec. Avoid coffee, sodas, peppermint, garlic, onions, alcohol, citrus fruits, chocolate, tomatoes, fatty and spicey foods.  Avoid eating 2-3 hours before bedtime.    4.  Colon Cancer Screening -Get previous colonoscopy results from [facility], and then we can decide timing of next colonoscopy.  Follow up 4 weeks after EGD with TG.  Brigitte Canard, PA-C

## 2024-03-29 ENCOUNTER — Other Ambulatory Visit (HOSPITAL_COMMUNITY): Payer: Self-pay | Admitting: *Deleted

## 2024-03-29 ENCOUNTER — Encounter: Payer: Self-pay | Admitting: Physician Assistant

## 2024-03-29 ENCOUNTER — Ambulatory Visit (INDEPENDENT_AMBULATORY_CARE_PROVIDER_SITE_OTHER): Admitting: Physician Assistant

## 2024-03-29 VITALS — BP 120/50 | HR 64 | Ht 64.5 in | Wt 139.0 lb

## 2024-03-29 DIAGNOSIS — R1310 Dysphagia, unspecified: Secondary | ICD-10-CM

## 2024-03-29 DIAGNOSIS — R066 Hiccough: Secondary | ICD-10-CM

## 2024-03-29 DIAGNOSIS — F1090 Alcohol use, unspecified, uncomplicated: Secondary | ICD-10-CM

## 2024-03-29 DIAGNOSIS — R059 Cough, unspecified: Secondary | ICD-10-CM

## 2024-03-29 DIAGNOSIS — K219 Gastro-esophageal reflux disease without esophagitis: Secondary | ICD-10-CM | POA: Diagnosis not present

## 2024-03-29 MED ORDER — PANTOPRAZOLE SODIUM 40 MG PO TBEC: 40.0000 mg | DELAYED_RELEASE_TABLET | Freq: Every day | ORAL | 3 refills | Status: DC

## 2024-03-29 NOTE — Patient Instructions (Signed)
 We have sent the following medications to your pharmacy for you to pick up at your convenience: Pantoprazole 40mg  daily   You have been scheduled for a Barium Esophogram at [facility] (1st floor of the hospital) on 04/14/24 at 11:00am. Please arrive 30 minutes prior to your appointment for registration. Make certain not to have anything to eat or drink 3 hours prior to your test. If you need to reschedule for any reason, please contact radiology at [phone] to do so. __________________________________________________________________ A barium swallow is an examination that concentrates on views of the esophagus. This tends to be a double contrast exam (barium and two liquids which, when combined, create a gas to distend the wall of the oesophagus) or single contrast (non-ionic iodine based). The study is usually tailored to your symptoms so a good history is essential. Attention is paid during the study to the form, structure and configuration of the esophagus, looking for functional disorders (such as aspiration, dysphagia, achalasia, motility and reflux) EXAMINATION You may be asked to change into a gown, depending on the type of swallow being performed. A radiologist and radiographer will perform the procedure. The radiologist will advise you of the type of contrast selected for your procedure and direct you during the exam. You will be asked to stand, sit or lie in several different positions and to hold a small amount of fluid in your mouth before being asked to swallow while the imaging is performed .In some instances you may be asked to swallow barium coated marshmallows to assess the motility of a solid food bolus. The exam can be recorded as a digital or video fluoroscopy procedure. POST PROCEDURE It will take 1-2 days for the barium to pass through your system. To facilitate this, it is important, unless otherwise directed, to increase your fluids for the next 24-48hrs and to resume your  normal diet.  This test typically takes about 30 minutes to perform. __________________________________________________________________________________  Elene Griffes have been scheduled for an endoscopy. Please follow written instructions given to you at your visit today.  If you use inhalers (even only as needed), please bring them with you on the day of your procedure.  If you take any of the following medications, they will need to be adjusted prior to your procedure:   DO NOT TAKE 7 DAYS PRIOR TO TEST- Trulicity (dulaglutide) Ozempic, Wegovy (semaglutide) Mounjaro (tirzepatide) Bydureon Bcise (exanatide extended release)  DO NOT TAKE 1 DAY PRIOR TO YOUR TEST Rybelsus (semaglutide) Adlyxin (lixisenatide) Victoza (liraglutide) Byetta (exanatide) ___________________________________________________________________________  Please follow up sooner if symptoms increase or worsen  Due to recent changes in healthcare laws, you may see the results of your imaging and laboratory studies on MyChart before your provider has had a chance to review them.  We understand that in some cases there may be results that are confusing or concerning to you. Not all laboratory results come back in the same time frame and the provider may be waiting for multiple results in order to interpret others.  Please give us  48 hours in order for your provider to thoroughly review all the results before contacting the office for clarification of your results.   _______________________________________________________  If your blood pressure at your visit was 140/90 or greater, please contact your primary care physician to follow up on this.  _______________________________________________________  If you are age 56 or older, your body mass index should be between 23-30. Your Body mass index is 23.49 kg/m. If this is out of the aforementioned  range listed, please consider follow up with your Primary Care Provider.  If you  are age 32 or younger, your body mass index should be between 19-25. Your Body mass index is 23.49 kg/m. If this is out of the aformentioned range listed, please consider follow up with your Primary Care Provider.   ________________________________________________________  The [facility] providers would like to encourage you to use MYCHART to communicate with providers for non-urgent requests or questions.  Due to long hold times on the telephone, sending your provider a message by [facility] may be a faster and more efficient way to get a response.  Please allow 48 business hours for a response.  Please remember that this is for non-urgent requests.  _______________________________________________________ Thank you for trusting me with your gastrointestinal care!   Michel Agreste, PA

## 2024-04-05 ENCOUNTER — Ambulatory Visit: Payer: Medicare Other | Admitting: Family Medicine

## 2024-04-05 ENCOUNTER — Telehealth: Payer: Self-pay | Admitting: Family Medicine

## 2024-04-05 NOTE — Telephone Encounter (Signed)
 I did not call patient. Looks like he had an appointment he cancelled today, may be from this?

## 2024-04-05 NOTE — Telephone Encounter (Signed)
 Copied from CRM [id number]. Topic: General - Call Back - No Documentation >> Apr 05, 2024 12:23 PM Cole Bond wrote: Reason for CRM: Patient called in saying that he had a missed call from the clinic. There was no documentation in the chart and he is unsure as to what the missed call was about. Patient is requesting a call back to the number on file [phone].

## 2024-04-09 ENCOUNTER — Telehealth: Payer: Self-pay | Admitting: Physician Assistant

## 2024-04-09 ENCOUNTER — Telehealth: Payer: Self-pay

## 2024-04-09 NOTE — Addendum Note (Signed)
 Addended by: Lanora Plana on: 04/09/2024 03:44 PM   Modules accepted: Orders

## 2024-04-09 NOTE — Telephone Encounter (Signed)
 I spoke with the patient and informed him that his Barium swallow w/ tablet is scheduled for 05/21 at 12:30 pm. He is aware and understands and will call back with any questions or concerns.

## 2024-04-09 NOTE — Telephone Encounter (Signed)
 Cole Bond,  I received a phone call from speech-language pathology at [facility] [place], [place] [phone]).  This patient has been scheduled for a modified barium swallow test.  I actually ordered a regular barium swallow with tablet to evaluate solid food dysphagia.  They are canceling the modified barium swallow test.  We need to order a regular barium swallow with tablet and notify patient when that is scheduled. Brigitte Canard, PA-C

## 2024-04-13 NOTE — Progress Notes (Signed)
 Noted

## 2024-04-14 ENCOUNTER — Ambulatory Visit: Payer: Self-pay | Admitting: Physician Assistant

## 2024-04-14 ENCOUNTER — Encounter (HOSPITAL_COMMUNITY)

## 2024-04-14 ENCOUNTER — Ambulatory Visit (HOSPITAL_COMMUNITY)
Admission: RE | Admit: 2024-04-14 | Discharge: 2024-04-14 | Disposition: A | Discharge status: Home / Self Care | Source: Ambulatory Visit | Attending: Physician Assistant | Admitting: Physician Assistant

## 2024-04-14 DIAGNOSIS — R066 Hiccough: Secondary | ICD-10-CM | POA: Diagnosis present

## 2024-04-14 DIAGNOSIS — K219 Gastro-esophageal reflux disease without esophagitis: Secondary | ICD-10-CM | POA: Insufficient documentation

## 2024-04-14 DIAGNOSIS — R1310 Dysphagia, unspecified: Secondary | ICD-10-CM | POA: Diagnosis present

## 2024-05-09 ENCOUNTER — Other Ambulatory Visit: Payer: Self-pay | Admitting: Family

## 2024-05-17 ENCOUNTER — Encounter: Payer: Self-pay | Admitting: Internal Medicine

## 2024-05-17 ENCOUNTER — Ambulatory Visit (AMBULATORY_SURGERY_CENTER): Admitting: Internal Medicine

## 2024-05-17 VITALS — BP 158/65 | HR 51 | Temp 98.2°F | Resp 10 | Ht 64.5 in | Wt 139.0 lb

## 2024-05-17 DIAGNOSIS — R1310 Dysphagia, unspecified: Secondary | ICD-10-CM

## 2024-05-17 DIAGNOSIS — Q399 Congenital malformation of esophagus, unspecified: Secondary | ICD-10-CM | POA: Diagnosis not present

## 2024-05-17 DIAGNOSIS — K222 Esophageal obstruction: Secondary | ICD-10-CM | POA: Diagnosis not present

## 2024-05-17 DIAGNOSIS — K2100 Gastro-esophageal reflux disease with esophagitis, without bleeding: Secondary | ICD-10-CM | POA: Diagnosis not present

## 2024-05-17 DIAGNOSIS — K449 Diaphragmatic hernia without obstruction or gangrene: Secondary | ICD-10-CM

## 2024-05-17 DIAGNOSIS — K219 Gastro-esophageal reflux disease without esophagitis: Secondary | ICD-10-CM

## 2024-05-17 MED ORDER — SODIUM CHLORIDE 0.9 % IV SOLN: 500.0000 mL | Freq: Once | INTRAVENOUS | Status: DC

## 2024-05-17 NOTE — Progress Notes (Signed)
 Expand All Collapse All       Ellouise Console, PA-C [address] [place], [place]  [address] Phone: [phone]   Gastroenterology Consultation   Referring Provider:     Almarie Waddell NOVAK, NP Primary Care Physician:  Almarie Waddell NOVAK, NP Primary Gastroenterologist:  Ellouise Console, PA-C / Norleen Kiang, MD  Reason for Consultation:     Dysphagia, GERD, hiccups        HPI:   Cole Bond is a 73 y.o. y/o male referred for consultation & management  by Almarie Waddell NOVAK, NP.  Patient is referred to evaluate dysphagia.  He feels like large food and capsules like Keppra  are getting stuck in the middle of his throat.  He has to take small bites.  Can eat smaller softer foods.  Sometimes has to lean over and try to swallow repeatedly without success.  Occasionally food comes back up.  He is not able to swallow his Keppra  which is a large capsule.  He needs this medication in order to control his seizures.  His last seizure was over 5 years ago.   He has also had recurrent chronic hiccups for 5 years and acid reflux symptoms.  Sometimes cannot catch his breath when he hiccups hard.  He drinks 2 or 3 beers daily.  No sodas or coffee.  He is not currently on any treatment for acid reflux.  Denies abdominal pain.  Has had mild weight loss.  Denies diarrhea, constipation, melena, or hematochezia.   Patient reports seeing gastroenterologist in [facility] 5 years ago.  He reports having an EGD and colonoscopy.  He was told he had gastric polyps.  We are requesting GI records.   11/2015 Colonoscopy: Results unavailable   PMH: Epilepsy, HTN       Past Medical History:  Diagnosis Date   Hyperlipemia     Hypertension     Pneumonia     Seizures (HCC)     Stroke [facility])                 Past Surgical History:  Procedure Laterality Date   APPENDECTOMY       CATARACT EXTRACTION, BILATERAL Bilateral                   Prior to Admission medications   Medication Sig Start Date End Date Taking?  Authorizing Provider  aspirin 325 MG tablet Take 325 mg by mouth daily.        [provider]  atorvastatin  (LIPITOR) 20 MG tablet TAKE 1 TABLET DAILY 04/21/23     Webb, Padonda B, FNP  hydrochlorothiazide  (MICROZIDE ) 12.5 MG capsule TAKE 1 CAPSULE DAILY 12/29/23     Almarie Waddell B, NP  ketoconazole  (NIZORAL ) 2 % shampoo APPLY TOPICALLY TWICE A WEEK 12/31/23     Beck, Taylor B, NP  levETIRAcetam  (KEPPRA ) 1000 MG tablet Take 1 tablet (1,000 mg total) by mouth 2 (two) times daily. 01/20/24     Georjean Darice HERO, MD  lisinopril  (ZESTRIL ) 20 MG tablet TAKE 1 TABLET DAILY 02/05/24     Almarie Waddell NOVAK, NP           Family History  Problem Relation Age of Onset   Breast cancer Mother     Stroke Father     Hypertension Father     Hypercholesterolemia Father     Diabetes Father     Hypertension Brother            Social  History  Social History         Tobacco Use   Smoking status: Former      Current packs/day: 0.00      Average packs/day: 1 pack/day for 20.0 years (20.0 ttl pk-yrs)      Types: Cigarettes, E-cigarettes      Start date: 05/01/1993      Quit date: 05/01/2013      Years since quitting: 10.9   Smokeless tobacco: Never  Vaping Use   Vaping status: Never Used  Substance Use Topics   Alcohol use: Yes      Alcohol/week: 3.0 standard drinks of alcohol      Types: 3 Cans of beer per week      Comment: 2 per day   Drug use: No           Allergies as of 03/29/2024   (No Known Allergies)      Review of Systems:    All systems reviewed and negative except where noted in HPI.    Physical Exam:  BP (!) 120/50 (BP Location: Left Arm, Patient Position: Sitting, Cuff Size: Normal)   Pulse 64   Ht 5' 4.5 (1.638 m) Comment: height measured without shoes  Wt 139 lb (63 kg)   BMI 23.49 kg/m  No LMP for male patient.   General:   Alert,  Well-developed, well-nourished, pleasant and cooperative in NAD Lungs:  Respirations even and unlabored.  Clear throughout to  auscultation.   No wheezes, crackles, or rhonchi. No acute distress. Mouth: He has upper and lower dentures.  No oral lesions. Heart:  Regular rate and rhythm; no murmurs, clicks, rubs, or gallops. Abdomen:  Normal bowel sounds.  No bruits.  Soft, and non-distended without masses, hepatosplenomegaly or hernias noted.  No Tenderness.  No guarding or rebound tenderness.    Neurologic:  Alert and oriented x3;  grossly normal neurologically. Psych:  Alert and cooperative. Normal mood and affect.   Imaging Studies: Imaging Results  No results found.     Labs: CBC Labs (Brief)          Component Value Date/Time    WBC 5.1 09/14/2023 1114    RBC 4.57 09/14/2023 1114    HGB 14.3 09/14/2023 1114    HCT 41.8 09/14/2023 1114    PLT 174 09/14/2023 1114    MCV 91.5 09/14/2023 1114    MCH 31.3 09/14/2023 1114    MCHC 34.2 09/14/2023 1114    RDW 12.3 09/14/2023 1114    LYMPHSABS 0.9 09/14/2023 1114    MONOABS 0.6 09/14/2023 1114    EOSABS 0.1 09/14/2023 1114    BASOSABS 0.0 09/14/2023 1114        CMP     Labs (Brief)          Component Value Date/Time    NA 139 10/07/2023 0842    K 4.4 10/07/2023 0842    CL 99 10/07/2023 0842    CO2 32 10/07/2023 0842    GLUCOSE 93 10/07/2023 0842    BUN 12 10/07/2023 0842    CREATININE 0.87 10/07/2023 0842    CALCIUM  9.5 10/07/2023 0842    PROT 7.1 10/07/2023 0842    ALBUMIN 4.6 10/07/2023 0842    AST 21 10/07/2023 0842    ALT 16 10/07/2023 0842    ALKPHOS 70 10/07/2023 0842    BILITOT 0.6 10/07/2023 0842    GFRNONAA >60 09/14/2023 1114    GFRAA >60 06/30/2019 1434  Assessment and Plan:    Cole Bond is a 73 y.o. y/o male has been referred for:    1.  Dysphagia to solid foods and pills - Barium swallow with tablet - EGD with possible dilation I discussed risks of EGD with patient to include risk of bleeding, perforation, and risk of sedation.  Patient expressed understanding and agrees to proceed with EGD.  -He will  talk with neurologist about getting liquid Keppra  or smaller capsules.   2.  Hiccups; intractable -EGD - I encouraged him to stop drinking beer and carbonated drinks.   3.  GERD -Start Rx pantoprazole  40 Mg 1 tablet once daily. -Recommend Lifestyle Modifications to prevent Acid Reflux.  Rec. Avoid coffee, sodas, peppermint, garlic, onions, alcohol, citrus fruits, chocolate, tomatoes, fatty and spicey foods.  Avoid eating 2-3 hours before bedtime.     4.  Colon Cancer Screening -Get previous colonoscopy results from [facility], and then we can decide timing of next colonoscopy.   Follow up 4 weeks after EGD with TG.   Ellouise Console, PA-C     Recent H&P as above.  No interval change.  Barium esophagram suggests achalasia.  Plans for upper endoscopy today with probable esophageal dilation.  Likely needs esophageal manometry.

## 2024-05-17 NOTE — Op Note (Signed)
 [facility] Patient Name: Cole Bond Procedure Date: 05/17/2024 12:51 PM MRN: [id number] Endoscopist: Norleen SAILOR. Abran , MD, [id number] Age: 73 Referring MD:  Date of Birth: 11/14/1951 Gender: Male Account #: [id number] Procedure:                Upper GI endoscopy with balloon dilation of the                            esophagus. 18-20 mm Indications:              Dysphagia, , Esophageal reflux, abnormal esophagram Medicines:                Monitored Anesthesia Care Procedure:                Pre-Anesthesia Assessment:                           - Prior to the procedure, a History and Physical                            was performed, and patient medications and                            allergies were reviewed. The patient's tolerance of                            previous anesthesia was also reviewed. The risks                            and benefits of the procedure and the sedation                            options and risks were discussed with the patient.                            All questions were answered, and informed consent                            was obtained. Prior Anticoagulants: The patient has                            taken no anticoagulant or antiplatelet agents. ASA                            Grade Assessment: II - A patient with mild systemic                            disease. After reviewing the risks and benefits,                            the patient was deemed in satisfactory condition to                            undergo the procedure.  After obtaining informed consent, the endoscope was                            passed under direct vision. Throughout the                            procedure, the patient's blood pressure, pulse, and                            oxygen saturations were monitored continuously. The                            Endoscope was introduced through the mouth, and                             advanced to the second part of duodenum. The upper                            GI endoscopy was accomplished without difficulty.                            The patient tolerated the procedure well. Scope In: Scope Out: Findings:                 The exam of the esophagus revealed to be somewhat                            tortuous. Mild esophagitis at the gastroesophageal                            junction.                           One benign-appearing, intrinsic moderate stenosis                            was found at the gastroesophageal junction. This                            stenosis measured 1.4 cm (inner diameter). A TTS                            dilator was passed through the scope. Dilation with                            an 18-19-20 mm balloon dilator was performed to 20                            mm. The ring was disrupted. See images                           The stomach was normal. Small hiatal hernia.                           The  examined duodenum was normal.                           The cardia and gastric fundus were normal on                            retroflexion. Complications:            No immediate complications. Estimated Blood Loss:     Estimated blood loss: none. Impression:               1 GERD with esophageal stricture status post                            dilation                           2. Otherwise unremarkable EGD. Recommendation:           1. Patient has a contact number available for                            emergencies. The signs and symptoms of potential                            delayed complications were discussed with the                            patient. Return to normal activities tomorrow.                            Written discharge instructions were provided to the                            patient.                           2. Post dilation diet.                           3. Continue present medications.                            4. CONTINUE PANTOPRAZOLE  40 mg DAILY. Please make                            sure the patient has multiple refills on his                            prescription.                           5. Office follow-up with Dr. Abran in 6 to 8 weeks Norleen SAILOR. Abran, MD 05/17/2024 1:56:20 PM This report has been signed electronically.

## 2024-05-17 NOTE — Progress Notes (Signed)
 Transferred to PACU via stretcher, arousing, VSS.

## 2024-05-17 NOTE — Patient Instructions (Addendum)
 Post dilation diet, CLEAR LIQUIDS FOR ONE HOUR THEN SOFT FOODS FOR THE REST OF TODAY Continue present medications--STAY ON PANTOPRAZOLE  40 MG DAILY Await pathology results Office visit WITH DR ABRAN IN 6-8 WEEKS, office nurse will call to schedule  Handouts/information given for GERD, stricture, hiatal hernia and Pantoprazole   YOU HAD AN ENDOSCOPIC PROCEDURE TODAY AT THE [facility]:   Refer to the procedure report that was given to you for any specific questions about what was found during the examination.  If the procedure report does not answer your questions, please call your gastroenterologist to clarify.  If you requested that your care partner not be given the details of your procedure findings, then the procedure report has been included in a sealed envelope for you to review at your convenience later.  YOU SHOULD EXPECT: Some feelings of bloating in the abdomen. Passage of more gas than usual.  Walking can help get rid of the air that was put into your GI tract during the procedure and reduce the bloating. If you had a lower endoscopy (such as a colonoscopy or flexible sigmoidoscopy) you may notice spotting of blood in your stool or on the toilet paper. If you underwent a bowel prep for your procedure, you may not have a normal bowel movement for a few days.  Please Note:  You might notice some irritation and congestion in your nose or some drainage.  This is from the oxygen used during your procedure.  There is no need for concern and it should clear up in a day or so.  SYMPTOMS TO REPORT IMMEDIATELY:  Following upper endoscopy (EGD)  Vomiting of blood or coffee ground material  New chest pain or pain under the shoulder blades  Painful or persistently difficult swallowing  New shortness of breath  Fever of 100F or higher  Black, tarry-looking stools For urgent or emergent issues, a gastroenterologist can be reached at any hour by calling [phone]. Do not use  MyChart messaging for urgent concerns.   DIET:  POST DILATION DIET, THEN TOMORROW, you may proceed to your regular diet.  Drink plenty of fluids but you should avoid alcoholic beverages for 24 hours.  ACTIVITY:  You should plan to take it easy for the rest of today and you should NOT DRIVE or use heavy machinery until tomorrow (because of the sedation medicines used during the test).    FOLLOW UP: Our staff will call the number listed on your records the next business day following your procedure.  We will call around 7:15- 8:00 am to check on you and address any questions or concerns that you may have regarding the information given to you following your procedure. If we do not reach you, we will leave a message.     If any biopsies were taken you will be contacted by phone or by letter within the next 1-3 weeks.  Please call us  at [phone] if you have not heard about the biopsies in 3 weeks.   SIGNATURES/CONFIDENTIALITY: You and/or your care partner have signed paperwork which will be entered into your electronic medical record.  These signatures attest to the fact that that the information above on your After Visit Summary has been reviewed and is understood.  Full responsibility of the confidentiality of this discharge information lies with you and/or your care-partner.

## 2024-05-18 ENCOUNTER — Telehealth: Payer: Self-pay | Admitting: *Deleted

## 2024-05-18 NOTE — Telephone Encounter (Signed)
  Follow up Call-     05/17/2024   12:55 PM  Call back number  Post procedure Call Back phone  # call wife Dene Czar, [phone]  Permission to leave phone message Yes     Patient questions:  Do you have a fever, pain , or abdominal swelling? No. Pain Score  0 *  Have you tolerated food without any problems? Yes.    Have you been able to return to your normal activities? Yes.    Do you have any questions about your discharge instructions: Diet   No. Medications  No. Follow up visit  No.  Do you have questions or concerns about your Care? Yes.    Actions: * If pain score is 4 or above: No action needed, pain <4.

## 2024-05-31 ENCOUNTER — Other Ambulatory Visit: Payer: Self-pay | Admitting: Family Medicine

## 2024-05-31 MED ORDER — ATORVASTATIN CALCIUM 20 MG PO TABS: 20.0000 mg | ORAL_TABLET | Freq: Every day | ORAL | 1 refills | Status: DC

## 2024-05-31 NOTE — Telephone Encounter (Signed)
 Copied from CRM [id number]. Topic: Clinical - Medication Refill >> May 31, 2024  7:33 AM Berneda FALCON wrote: Medication: atorvastatin  (LIPITOR) 20 MG tablet  Has the patient contacted their pharmacy? Yes (Agent: If no, request that the patient contact the pharmacy for the refill. If patient does not wish to contact the pharmacy document the reason why and proceed with request.) (Agent: If yes, when and what did the pharmacy advise?)  This is the patient's preferred pharmacy:  [facility] - [place], [place] - [address] [place] [address] Phone: [phone] Fax: [phone]  Is this the correct pharmacy for this prescription? Yes If no, delete pharmacy and type the correct one.   Has the prescription been filled recently? No  Is the patient out of the medication? No, has enough for this week or so.  Has the patient been seen for an appointment in the last year OR does the patient have an upcoming appointment? Yes  Can we respond through MyChart? Yes  Agent: Please be advised that Rx refills may take up to 3 business days. We ask that you follow-up with your pharmacy.

## 2024-07-07 ENCOUNTER — Ambulatory Visit: Admitting: Internal Medicine

## 2024-07-07 ENCOUNTER — Encounter: Payer: Self-pay | Admitting: Internal Medicine

## 2024-07-07 VITALS — HR 68 | Ht 64.5 in | Wt 138.0 lb

## 2024-07-07 DIAGNOSIS — R066 Hiccough: Secondary | ICD-10-CM

## 2024-07-07 DIAGNOSIS — K222 Esophageal obstruction: Secondary | ICD-10-CM

## 2024-07-07 DIAGNOSIS — R1310 Dysphagia, unspecified: Secondary | ICD-10-CM

## 2024-07-07 DIAGNOSIS — K219 Gastro-esophageal reflux disease without esophagitis: Secondary | ICD-10-CM | POA: Diagnosis not present

## 2024-07-07 MED ORDER — PANTOPRAZOLE SODIUM 40 MG PO TBEC: 40.0000 mg | DELAYED_RELEASE_TABLET | Freq: Every day | ORAL | 3 refills | Status: AC

## 2024-07-07 NOTE — Progress Notes (Signed)
 HISTORY OF PRESENT ILLNESS:  Cole Bond is a 73 y.o. male who was evaluated in the office by the GI physician assistant Mar 29, 2024 regarding GERD, dysphagia, and chronic reflux.  See that dictation.  He subsequently underwent upper endoscopy May 17, 2024.  The esophagus was somewhat tortuous with mild distal esophagitis and a benign distal stricture which was dilated to 20 mm.  He was continued on pantoprazole  and asked to follow-up at this time.  Patient states that he is doing better.  No active reflux symptoms.  Dysphagia improved.  Still with hiccups.  REVIEW OF SYSTEMS:  All non-GI ROS negative.  Past Medical History:  Diagnosis Date   GERD (gastroesophageal reflux disease)    Hyperlipemia    Hypertension    Pneumonia    Seizures (HCC)    Stroke [facility])     Past Surgical History:  Procedure Laterality Date   APPENDECTOMY     CATARACT EXTRACTION, BILATERAL Bilateral    UPPER GASTROINTESTINAL ENDOSCOPY      Social History Cole Bond  reports that he quit smoking about 11 years ago. His smoking use included cigarettes and e-cigarettes. He started smoking about 31 years ago. He has a 20 pack-year smoking history. He has never used smokeless tobacco. He reports current alcohol use of about 3.0 standard drinks of alcohol per week. He reports that he does not use drugs.  family history includes Breast cancer in his mother; Diabetes in his father; Hypercholesterolemia in his father; Hypertension in his brother and father; Stroke in his father.  No Known Allergies     PHYSICAL EXAMINATION: Vital signs: Pulse 68   Ht 5' 4.5 (1.638 m)   Wt 138 lb (62.6 kg)   BMI 23.32 kg/m  General: Well-developed, well-nourished, no acute distress HEENT: Sclerae are anicteric, conjunctiva pink. Oral mucosa intact Lungs: Clear Heart: Regular Abdomen: soft, nontender, nondistended, no obvious ascites, no peritoneal signs, normal bowel sounds. No organomegaly. Extremities: No  edema Psychiatric: alert and oriented x3. Cooperative     ASSESSMENT:  1.  GERD complicated by peptic stricture.  Symptomatically improved post dilation on PPI 2.  Chronic hiccups.  Ongoing 3.  General Medical problems.  Stable   PLAN:  1.  Reflux precautions 2.  Continue pantoprazole  40 mg daily.  Medication list was reviewed.  Prescribed for 1 year 3.  Routine office follow-up 1 year.  Contact the office in the interim for any questions or issues with recurrent dysphagia.  He understands and agrees.

## 2024-07-07 NOTE — Patient Instructions (Signed)
 We have sent the following medications to your pharmacy for you to pick up at your convenience:  Protonix   Please follow up in one year.  _______________________________________________________  If your blood pressure at your visit was 140/90 or greater, please contact your primary care physician to follow up on this.  _______________________________________________________  If you are age 73 or older, your body mass index should be between 23-30. Your Body mass index is 23.32 kg/m. If this is out of the aforementioned range listed, please consider follow up with your Primary Care Provider.  If you are age 49 or younger, your body mass index should be between 19-25. Your Body mass index is 23.32 kg/m. If this is out of the aformentioned range listed, please consider follow up with your Primary Care Provider.   ________________________________________________________  The [facility] providers would like to encourage you to use MYCHART to communicate with providers for non-urgent requests or questions.  Due to long hold times on the telephone, sending your provider a message by [facility] may be a faster and more efficient way to get a response.  Please allow 48 business hours for a response.  Please remember that this is for non-urgent requests.  _______________________________________________________  [facility] is using a team-based approach to care.  Your team is made up of your doctor and two to three APPS. Our APPS (Nurse Practitioners and Physician Assistants) work with your physician to ensure care continuity for you. They are fully qualified to address your health concerns and develop a treatment plan. They communicate directly with your gastroenterologist to care for you. Seeing the Advanced Practice Practitioners on your physician's team can help you by facilitating care more promptly, often allowing for earlier appointments, access to diagnostic testing, procedures, and  other specialty referrals.

## 2024-07-27 ENCOUNTER — Ambulatory Visit (INDEPENDENT_AMBULATORY_CARE_PROVIDER_SITE_OTHER)

## 2024-07-27 VITALS — Ht 64.5 in | Wt 138.0 lb

## 2024-07-27 DIAGNOSIS — Z0000 Encounter for general adult medical examination without abnormal findings: Secondary | ICD-10-CM | POA: Diagnosis not present

## 2024-07-27 NOTE — Progress Notes (Signed)
 Subjective:   Cole Bond is a 73 y.o. who presents for a Medicare Wellness preventive visit.  As a reminder, Annual Wellness Visits don't include a physical exam, and some assessments may be limited, especially if this visit is performed virtually. We may recommend an in-person follow-up visit with your provider if needed.  Visit Complete: Virtual I connected with  Cole Bond on 07/27/24 by a audio enabled telemedicine application and verified that I am speaking with the correct person using two identifiers.  Patient Location: Home  Provider Location: Home Office  I discussed the limitations of evaluation and management by telemedicine. The patient expressed understanding and agreed to proceed.  Vital Signs: Because this visit was a virtual/telehealth visit, some criteria may be missing or patient reported. Any vitals not documented were not able to be obtained and vitals that have been documented are patient reported.  VideoDeclined- This patient declined Librarian, academic. Therefore the visit was completed with audio only.  Persons Participating in Visit: Patient.  AWV Questionnaire: No: Patient Medicare AWV questionnaire was not completed prior to this visit.  Cardiac Risk Factors include: advanced age (>39men, >26 women);male gender;dyslipidemia;hypertension;smoking/ tobacco exposure     Objective:    Today's Vitals   07/27/24 1553  Weight: 138 lb (62.6 kg)  Height: 5' 4.5 (1.638 m)   Body mass index is 23.32 kg/m.     07/27/2024    3:56 PM 01/20/2024    8:04 AM 09/14/2023   11:09 AM 05/30/2023    3:40 PM 01/27/2023    8:12 AM 03/25/2022    3:42 PM 02/25/2022    2:28 PM  Advanced Directives  Does Patient Have a Medical Advance Directive? No Yes Yes Yes Yes Yes Yes  Type of Scientist, research (physical sciences) will Healthcare Power of [place];Living will Living will;Healthcare Power of [employer] Power  of [place];Living will Living will  Does patient want to make changes to medical advance directive?  No - Patient declined No - Patient declined      Copy of Healthcare Power of Attorney in Chart?  No - copy requested  No - copy requested  No - copy requested   Would patient like information on creating a medical advance directive? Yes (MAU/Ambulatory/Procedural Areas - Information given)          Current Medications (verified) Outpatient Encounter Medications as of 07/27/2024  Medication Sig   aspirin 325 MG tablet Take 325 mg by mouth daily.    atorvastatin  (LIPITOR) 20 MG tablet Take 1 tablet (20 mg total) by mouth daily.   hydrochlorothiazide  (MICROZIDE ) 12.5 MG capsule TAKE 1 CAPSULE DAILY   ketoconazole  (NIZORAL ) 2 % shampoo APPLY TOPICALLY TWICE A WEEK   levETIRAcetam  (KEPPRA ) 1000 MG tablet Take 1 tablet (1,000 mg total) by mouth 2 (two) times daily.   lisinopril  (ZESTRIL ) 20 MG tablet TAKE 1 TABLET DAILY   pantoprazole  (PROTONIX ) 40 MG tablet Take 1 tablet (40 mg total) by mouth daily.   No facility-administered encounter medications on file as of 07/27/2024.    Allergies (verified) Patient has no known allergies.   History: Past Medical History:  Diagnosis Date   GERD (gastroesophageal reflux disease)    Hyperlipemia    Hypertension    Pneumonia    Seizures (HCC)    Stroke [facility])    Past Surgical History:  Procedure Laterality Date   APPENDECTOMY     CATARACT EXTRACTION, BILATERAL Bilateral    EYE  SURGERY     UPPER GASTROINTESTINAL ENDOSCOPY     Family History  Problem Relation Age of Onset   Breast cancer Mother    Cancer Mother    Stroke Father    Hypertension Father    Hypercholesterolemia Father    Diabetes Father    Hypertension Brother    Colon cancer Neg Hx    Esophageal cancer Neg Hx    Rectal cancer Neg Hx    Stomach cancer Neg Hx    Social History   Socioeconomic History   Marital status: Married    Spouse name: Not on file   Number of  children: 2   Years of education: Not on file   Highest education level: GED or equivalent  Occupational History   Occupation: Museum/gallery exhibitions officer  Tobacco Use   Smoking status: Former    Current packs/day: 0.00    Average packs/day: 1 pack/day for 20.0 years (20.0 ttl pk-yrs)    Types: Cigarettes, E-cigarettes    Start date: 05/01/1993    Quit date: 05/01/2013    Years since quitting: 11.2   Smokeless tobacco: Never  Vaping Use   Vaping status: Never Used  Substance and Sexual Activity   Alcohol use: Yes    Alcohol/week: 3.0 standard drinks of alcohol    Types: 3 Cans of beer per week    Comment: 2 per day   Drug use: No   Sexual activity: Not Currently    Partners: Female  Other Topics Concern   Not on file  Social History Narrative   Lives with wife in one story home      Right haned      Highest level of edu- ged       Caffeine: Yes      Social Drivers of Health   Financial Resource Strain: Low Risk  (07/27/2024)   Overall Financial Resource Strain (CARDIA)    Difficulty of Paying Living Expenses: Not hard at all  Food Insecurity: No Food Insecurity (07/27/2024)   Hunger Vital Sign    Worried About Running Out of Food in the Last Year: Never true    Ran Out of Food in the Last Year: Never true  Transportation Needs: No Transportation Needs (07/27/2024)   PRAPARE - Administrator, Civil Service (Medical): No    Lack of Transportation (Non-Medical): No  Physical Activity: Sufficiently Active (07/27/2024)   Exercise Vital Sign    Days of Exercise per Week: 5 days    Minutes of Exercise per Session: 30 min  Stress: No Stress Concern Present (07/27/2024)   [employer] of Occupational Health - Occupational Stress Questionnaire    Feeling of Stress: Not at all  Social Connections: Moderately Isolated (07/27/2024)   Social Connection and Isolation Panel    Frequency of Communication with Friends and Family: Twice a week    Frequency of Social Gatherings with  Friends and Family: Once a week    Attends Religious Services: Patient declined    Database administrator or Organizations: No    Attends Engineer, structural: Never    Marital Status: Married    Tobacco Counseling Counseling given: Not Answered    Clinical Intake:  Pre-visit preparation completed: Yes  Pain : No/denies pain     Diabetes: No  No results found for: HGBA1C   How often do you need to have someone help you when you read instructions, pamphlets, or other written materials from your doctor or pharmacy?:  1 - Never  Interpreter Needed?: No  Information entered by :: Charmaine Bloodgood LPN   Activities of Daily Living     07/27/2024    3:54 PM  In your present state of health, do you have any difficulty performing the following activities:  Hearing? 0  Vision? 0  Difficulty concentrating or making decisions? 0  Walking or climbing stairs? 0  Dressing or bathing? 0  Doing errands, shopping? 0  Preparing Food and eating ? N  Using the Toilet? N  In the past six months, have you accidently leaked urine? N  Do you have problems with loss of bowel control? N  Managing your Medications? N  Managing your Finances? N  Housekeeping or managing your Housekeeping? N    Patient Care Team: Almarie Waddell NOVAK, NP as PCP - General (Family Medicine) Georjean Darice HERO, MD as Consulting Physician (Neurology) Abran Norleen SAILOR, MD as Consulting Physician (Gastroenterology)  I have updated your Care Teams any recent Medical Services you may have received from other providers in the past year.     Assessment:   This is a routine wellness examination for Story.  Hearing/Vision screen Hearing Screening - Comments:: Denies hearing difficulties   Vision Screening - Comments:: No vision problems    Goals Addressed             This Visit's Progress    Maintain health and independence   On track      Depression Screen     07/27/2024    4:03 PM 05/30/2023    3:42  PM 04/14/2023    8:13 AM 10/21/2022    8:09 AM 04/16/2022    8:09 AM 03/25/2022    3:42 PM 01/17/2022   10:00 AM  PHQ 2/9 Scores  PHQ - 2 Score 0 0 0 0 0 0 0  PHQ- 9 Score   0        Fall Risk     07/27/2024    4:05 PM 01/20/2024    8:04 AM 05/28/2023    3:15 PM 04/14/2023    8:02 AM 01/27/2023    8:12 AM  Fall Risk   Falls in the past year? 0 0 0 0 0  Number falls in past yr: 0 0 0 0 0  Injury with Fall? 0 0 0 0 0  Risk for fall due to : No Fall Risks  No Fall Risks No Fall Risks   Follow up Falls prevention discussed;Education provided;Falls evaluation completed Falls evaluation completed Falls evaluation completed  Falls evaluation completed    MEDICARE RISK AT HOME:  Medicare Risk at Home Any stairs in or around the home?: No If so, are there any without handrails?: No Home free of loose throw rugs in walkways, pet beds, electrical cords, etc?: Yes Adequate lighting in your home to reduce risk of falls?: Yes Life alert?: No Use of a cane, walker or w/c?: No Grab bars in the bathroom?: Yes Shower chair or bench in shower?: No Elevated toilet seat or a handicapped toilet?: Yes  TIMED UP AND GO:  Was the test performed?  No  Cognitive Function: 6CIT completed    12/11/2018   10:00 AM 04/29/2018   12:00 PM  MMSE - Mini Mental State Exam  Orientation to time 5 5  Orientation to Place 5 5  Registration 3 3  Attention/ Calculation 5 5  Recall 2 2  Language- name 2 objects 2 2  Language- repeat 1 1  Language- follow  3 step command 3 3  Language- read & follow direction 1 1  Write a sentence 1 1  Copy design 1 1  Total score 29 29        07/27/2024    4:05 PM 05/30/2023    3:43 PM 03/25/2022    3:45 PM  6CIT Screen  What Year? 0 points 0 points 0 points  What month? 0 points 0 points 0 points  What time? 0 points 0 points 0 points  Count back from 20 0 points 0 points 0 points  Months in reverse 0 points 0 points 0 points  Repeat phrase 0 points 0 points 2 points  Total  Score 0 points 0 points 2 points    Immunizations Immunization History  Administered Date(s) Administered   Influenza,inj,Quad PF,6+ Mos 10/28/2016   Influenza-Unspecified 10/28/2016   Pneumococcal Conjugate-13 10/28/2016    Screening Tests Health Maintenance  Topic Date Due   Hepatitis C Screening  Never done   Pneumococcal Vaccine: 50+ Years (2 of 2 - PPSV23, PCV20, or PCV21) 12/23/2016   Lung Cancer Screening  06/29/2020   Zoster Vaccines- Shingrix (1 of 2) 02/04/2025 (Originally 06/05/2001)   INFLUENZA VACCINE  02/22/2025 (Originally 06/25/2024)   Medicare Annual Wellness (AWV)  07/27/2025   Colonoscopy  11/28/2025   HPV VACCINES  Aged Out   Meningococcal B Vaccine  Aged Out   DTaP/Tdap/Td  Discontinued   COVID-19 Vaccine  Discontinued    Health Maintenance  Health Maintenance Due  Topic Date Due   Hepatitis C Screening  Never done   Pneumococcal Vaccine: 50+ Years (2 of 2 - PPSV23, PCV20, or PCV21) 12/23/2016   Lung Cancer Screening  06/29/2020   Health Maintenance Items Addressed: Patient declines vaccines   Additional Screening:  Vision Screening: Recommended annual ophthalmology exams for early detection of glaucoma and other disorders of the eye. Would you like a referral to an eye doctor? No    Dental Screening: Recommended annual dental exams for proper oral hygiene  Community Resource Referral / Chronic Care Management: CRR required this visit?  No   CCM required this visit?  No   Plan:    I have personally reviewed and noted the following in the patient's chart:   Medical and social history Use of alcohol, tobacco or illicit drugs  Current medications and supplements including opioid prescriptions. Patient is not currently taking opioid prescriptions. Functional ability and status Nutritional status Physical activity Advanced directives List of other physicians Hospitalizations, surgeries, and ER visits in previous 12  months Vitals Screenings to include cognitive, depression, and falls Referrals and appointments  In addition, I have reviewed and discussed with patient certain preventive protocols, quality metrics, and best practice recommendations. A written personalized care plan for preventive services as well as general preventive health recommendations were provided to patient.   Lavelle Pfeiffer Ferdinand, [place]   0/05/7973   After Visit Summary: (MyChart) Due to this being a telephonic visit, the after visit summary with patients personalized plan was offered to patient via MyChart   Notes: Nothing significant to report at this time.

## 2024-07-27 NOTE — Patient Instructions (Signed)
 Cole Bond , Thank you for taking time out of your busy schedule to complete your Annual Wellness Visit with me. I enjoyed our conversation and look forward to speaking with you again next year. I, as well as your care team,  appreciate your ongoing commitment to your health goals. Please review the following plan we discussed and let me know if I can assist you in the future. Your Game plan/ To Do List    Follow up Visits: We will see or speak with you next year for your Next Medicare AWV with our clinical staff Have you seen your provider in the last 6 months (3 months if uncontrolled diabetes)? Yes  Clinician Recommendations:  Aim for 30 minutes of exercise or brisk walking, 6-8 glasses of water, and 5 servings of fruits and vegetables each day.       This is a list of the screenings recommended for you:  Health Maintenance  Topic Date Due   Hepatitis C Screening  Never done   Pneumococcal Vaccine for age over 38 (2 of 2 - PPSV23, PCV20, or PCV21) 12/23/2016   Screening for Lung Cancer  06/29/2020   Zoster (Shingles) Vaccine (1 of 2) 02/04/2025*   Flu Shot  02/22/2025*   Medicare Annual Wellness Visit  07/27/2025   Colon Cancer Screening  11/28/2025   HPV Vaccine  Aged Out   Meningitis B Vaccine  Aged Out   DTaP/Tdap/Td vaccine  Discontinued   COVID-19 Vaccine  Discontinued  *Topic was postponed. The date shown is not the original due date.    Advanced directives: (ACP Link)Information on Advanced Care Planning can be found at [place]  Secretary of [facility] Advance Health Care Directives Advance Health Care Directives. [email or web address]   Advance Care Planning is important because it:  [x]  Makes sure you receive the medical care that is consistent with your values, goals, and preferences  [x]  It provides guidance to your family and loved ones and reduces their decisional burden about whether or not they are making the right decisions based on your wishes.  Follow the link provided  in your after visit summary or read over the paperwork we have mailed to you to help you started getting your Advance Directives in place. If you need assistance in completing these, please reach out to us  so that we can help you!  See attachments for Preventive Care and Fall Prevention Tips.

## 2024-08-06 ENCOUNTER — Encounter: Payer: Self-pay | Admitting: Family Medicine

## 2024-08-06 ENCOUNTER — Ambulatory Visit: Admitting: Family Medicine

## 2024-08-06 VITALS — BP 161/51 | HR 58 | Ht 64.5 in | Wt 139.0 lb

## 2024-08-06 DIAGNOSIS — I10 Essential (primary) hypertension: Secondary | ICD-10-CM

## 2024-08-06 DIAGNOSIS — E782 Mixed hyperlipidemia: Secondary | ICD-10-CM | POA: Diagnosis not present

## 2024-08-06 LAB — COMPLETE METABOLIC PANEL WITHOUT GFR
AG Ratio: 1.9 (calc) (ref 1.0–2.5)
ALT: 13 U/L (ref 9–46)
AST: 20 U/L (ref 10–35)
Albumin: 4.5 g/dL (ref 3.6–5.1)
Alkaline phosphatase (APISO): 60 U/L (ref 35–144)
BUN: 12 mg/dL (ref 7–25)
CO2: 31 mmol/L (ref 20–32)
Calcium: 9.4 mg/dL (ref 8.6–10.3)
Chloride: 100 mmol/L (ref 98–110)
Creat: 0.93 mg/dL (ref 0.70–1.28)
Globulin: 2.4 g/dL (ref 1.9–3.7)
Glucose, Bld: 95 mg/dL (ref 65–99)
Potassium: 4 mmol/L (ref 3.5–5.3)
Sodium: 138 mmol/L (ref 135–146)
Total Bilirubin: 0.5 mg/dL (ref 0.2–1.2)
Total Protein: 6.9 g/dL (ref 6.1–8.1)

## 2024-08-06 LAB — LIPID PANEL
Cholesterol: 136 mg/dL (ref 0–200)
HDL: 55.7 mg/dL (ref >39.00)
LDL Cholesterol: 70 mg/dL (ref 0–99)
NonHDL: 80.62
Total CHOL/HDL Ratio: 2
Triglycerides: 53 mg/dL (ref 0.0–149.0)
VLDL: 10.6 mg/dL (ref 0.0–40.0)

## 2024-08-06 LAB — TSH: TSH: 6.44 u[IU]/mL — ABNORMAL HIGH (ref 0.35–5.50)

## 2024-08-06 NOTE — Assessment & Plan Note (Signed)
 Blood pressure is NOT at goal for age and co-morbidities, but he did not take his meds today and does not monitor at home. Recommendations: continue lisinopril  20 mg daily and HCTZ 12.5 mg daily - nurse visit in 2 weeks to recheck (take meds before appointment) - BP goal <130/80 - monitor and log blood pressures at home - check around the same time each day in a relaxed setting - Limit salt to <2000 mg/day - Follow DASH eating plan (heart healthy diet) - limit alcohol to 2 standard drinks per day for men and 1 per day for women - avoid tobacco products - get at least 2 hours of regular aerobic exercise weekly Patient aware of signs/symptoms requiring further/urgent evaluation.

## 2024-08-06 NOTE — Progress Notes (Signed)
 Established Patient Office Visit  Subjective   Patient ID: Cole Bond, male    DOB: 06-13-1951  Age: 73 y.o. MRN: [id number]  Chief Complaint  Patient presents with   Medical Management of Chronic Issues   Hypertension    HPI  Patient is here for routine follow-up.     HYPERTENSION: - Medications: HCTZ 12.5 mg daily, lisinopril  20 mg daily - Compliance: good; has not yet taken today - Checking BP at home: rarely, usually good  - Denies any SOB, recurrent headaches, CP, vision changes, LE edema, dizziness, palpitations, or medication side effects. - Diet: general, heart healthy - Exercise: very active with work    HYPERLIPIDEMIA - medications: atorvastatin  20 mg daily - compliance: good - medication SEs: none The ASCVD Risk score (Arnett DK, et al., 2019) failed to calculate for the following reasons:   Risk score cannot be calculated because patient has a medical history suggesting prior/existing ASCVD       ROS All review of systems negative except what is listed in the HPI    Objective:     BP (!) 161/51   Pulse (!) 58   Ht 5' 4.5 (1.638 m)   Wt 139 lb (63 kg)   SpO2 98%   BMI 23.49 kg/m    Physical Exam Vitals reviewed.  Constitutional:      Appearance: Normal appearance.  Cardiovascular:     Rate and Rhythm: Normal rate and regular rhythm.  Pulmonary:     Effort: Pulmonary effort is normal.     Breath sounds: Normal breath sounds.  Skin:    General: Skin is warm and dry.  Neurological:     Mental Status: He is alert and oriented to person, place, and time.  Psychiatric:        Mood and Affect: Mood normal.        Behavior: Behavior normal.        Thought Content: Thought content normal.        Judgment: Judgment normal.       No results found for any visits on 08/06/24.    The ASCVD Risk score (Arnett DK, et al., 2019) failed to calculate for the following reasons:   Risk score cannot be calculated because patient has a  medical history suggesting prior/existing ASCVD    Assessment & Plan:    Problem List Items Addressed This Visit       Active Problems   Hyperlipemia (Chronic)   Medication management: continue atorvastatin  20 mg daily  Lifestyle factors for lowering cholesterol include: Diet therapy - heart-healthy diet rich in fruits, veggies, fiber-rich whole grains, lean meats, chicken, fish (at least twice a week), fat-free or 1% dairy products; foods low in saturated/trans fats, cholesterol, sodium, and sugar. Mediterranean diet has shown to be very heart healthy. Regular exercise - recommend at least 30 minutes a day, 5 times per week Weight management  Labs stable 4 months ago, deferred recheck       Relevant Orders   Lipid panel   Hypertension - Primary (Chronic)   Blood pressure is NOT at goal for age and co-morbidities, but he did not take his meds today and does not monitor at home. Recommendations: continue lisinopril  20 mg daily and HCTZ 12.5 mg daily - nurse visit in 2 weeks to recheck (take meds before appointment) - BP goal <130/80 - monitor and log blood pressures at home - check around the same time each day in a relaxed setting -  Limit salt to <2000 mg/day - Follow DASH eating plan (heart healthy diet) - limit alcohol to 2 standard drinks per day for men and 1 per day for women - avoid tobacco products - get at least 2 hours of regular aerobic exercise weekly Patient aware of signs/symptoms requiring further/urgent evaluation.       Relevant Orders   COMPLETE METABOLIC PANEL WITH eGFR   TSH      Return in about 2 weeks (around 08/20/2024) for BP check with nurse; 6 months routine f/u.    Waddell KATHEE Mon, NP

## 2024-08-06 NOTE — Assessment & Plan Note (Signed)
 Medication management: continue atorvastatin 20 mg daily  Lifestyle factors for lowering cholesterol include: Diet therapy - heart-healthy diet rich in fruits, veggies, fiber-rich whole grains, lean meats, chicken, fish (at least twice a week), fat-free or 1% dairy products; foods low in saturated/trans fats, cholesterol, sodium, and sugar. Mediterranean diet has shown to be very heart healthy. Regular exercise - recommend at least 30 minutes a day, 5 times per week Weight management  Labs stable 4 months ago, deferred recheck

## 2024-08-09 ENCOUNTER — Ambulatory Visit: Payer: Self-pay | Admitting: Family Medicine

## 2024-08-09 ENCOUNTER — Other Ambulatory Visit

## 2024-08-09 DIAGNOSIS — R7989 Other specified abnormal findings of blood chemistry: Secondary | ICD-10-CM

## 2024-08-09 DIAGNOSIS — I10 Essential (primary) hypertension: Secondary | ICD-10-CM

## 2024-08-10 ENCOUNTER — Ambulatory Visit: Payer: Self-pay | Admitting: Family Medicine

## 2024-08-10 LAB — T4, FREE: Free T4: 1.1 ng/dL (ref 0.8–1.8)

## 2024-08-20 ENCOUNTER — Ambulatory Visit (INDEPENDENT_AMBULATORY_CARE_PROVIDER_SITE_OTHER): Admitting: Internal Medicine

## 2024-08-20 DIAGNOSIS — I10 Essential (primary) hypertension: Secondary | ICD-10-CM | POA: Diagnosis not present

## 2024-08-20 NOTE — Progress Notes (Signed)
 Pt here for Blood pressure check per   Pt currently takes: hydrochlorothiazide  12.5 MG and Lisinopril  20 MG    Pt reports compliance with medication.  BP today @ = L arm 155/48 R arm 130/50 Repeat L arm 140/56 R arm 140/50  HR = 60   Pt advised per DOD Dr. Amon to continue with same medication regimen and return in one month for a [place] blood pressure check. Scheduled BP check for 09/24/2024.

## 2024-09-07 ENCOUNTER — Encounter: Payer: Self-pay | Admitting: Internal Medicine

## 2024-09-24 ENCOUNTER — Ambulatory Visit

## 2024-09-24 DIAGNOSIS — I10 Essential (primary) hypertension: Secondary | ICD-10-CM | POA: Diagnosis not present

## 2024-09-24 NOTE — Progress Notes (Signed)
 Pt here for Blood pressure check per Amon from last BP check  Pt currently takes:   hydrochlorothiazide  12.5mg , 1 capsule daily  Lisinopril  20mg , 1 tablet daily   Pt reports compliance with medication.  BP today @ = 122/70 HR = 54  Pt advised per Dr. Amon to continue medication as is and follow up with Waddell at scheduled visit in March.

## 2024-10-06 ENCOUNTER — Encounter: Payer: Self-pay | Admitting: Neurology

## 2024-12-16 ENCOUNTER — Other Ambulatory Visit: Payer: Self-pay | Admitting: Family Medicine

## 2024-12-26 ENCOUNTER — Other Ambulatory Visit: Payer: Self-pay | Admitting: Family Medicine

## 2025-01-19 ENCOUNTER — Ambulatory Visit: Payer: Medicare Other | Admitting: Neurology

## 2025-02-04 ENCOUNTER — Ambulatory Visit: Admitting: Family Medicine

## 2025-08-09 ENCOUNTER — Ambulatory Visit
